# Patient Record
Sex: Female | Born: 1965 | Race: Black or African American | Hispanic: No | Marital: Single | Smoking: Never smoker
Health system: Southern US, Community
[De-identification: ages and names within clinical notes are randomized; demographics above are authoritative.]

## PROBLEM LIST (undated history)

## (undated) DIAGNOSIS — R42 Dizziness and giddiness: Secondary | ICD-10-CM

## (undated) DIAGNOSIS — M1990 Unspecified osteoarthritis, unspecified site: Secondary | ICD-10-CM

## (undated) DIAGNOSIS — M797 Fibromyalgia: Secondary | ICD-10-CM

## (undated) HISTORY — PX: DILATION AND CURETTAGE OF UTERUS: SHX78

---

## 1988-12-29 HISTORY — PX: HAMMER TOE SURGERY: SHX385

## 1998-02-17 ENCOUNTER — Other Ambulatory Visit: Admission: RE | Admit: 1998-02-17 | Discharge: 1998-02-17 | Payer: Self-pay | Admitting: *Deleted

## 1998-08-27 ENCOUNTER — Emergency Department (HOSPITAL_COMMUNITY): Admission: EM | Admit: 1998-08-27 | Discharge: 1998-08-27 | Payer: Self-pay | Admitting: Emergency Medicine

## 1999-09-28 ENCOUNTER — Other Ambulatory Visit: Admission: RE | Admit: 1999-09-28 | Discharge: 1999-09-28 | Payer: Self-pay | Admitting: Obstetrics and Gynecology

## 2001-07-01 ENCOUNTER — Other Ambulatory Visit: Admission: RE | Admit: 2001-07-01 | Discharge: 2001-07-01 | Payer: Self-pay | Admitting: Obstetrics and Gynecology

## 2001-07-07 ENCOUNTER — Encounter: Payer: Self-pay | Admitting: Obstetrics and Gynecology

## 2001-07-07 ENCOUNTER — Ambulatory Visit (HOSPITAL_COMMUNITY): Admission: RE | Admit: 2001-07-07 | Discharge: 2001-07-07 | Payer: Self-pay | Admitting: Obstetrics and Gynecology

## 2001-07-15 ENCOUNTER — Ambulatory Visit (HOSPITAL_COMMUNITY): Admission: RE | Admit: 2001-07-15 | Discharge: 2001-07-15 | Payer: Self-pay | Admitting: Obstetrics and Gynecology

## 2001-07-15 ENCOUNTER — Encounter: Payer: Self-pay | Admitting: Obstetrics and Gynecology

## 2002-08-25 ENCOUNTER — Other Ambulatory Visit: Admission: RE | Admit: 2002-08-25 | Discharge: 2002-08-25 | Payer: Self-pay | Admitting: Obstetrics and Gynecology

## 2003-04-16 ENCOUNTER — Other Ambulatory Visit: Admission: RE | Admit: 2003-04-16 | Discharge: 2003-04-16 | Payer: Self-pay | Admitting: Obstetrics and Gynecology

## 2003-04-19 ENCOUNTER — Encounter (INDEPENDENT_AMBULATORY_CARE_PROVIDER_SITE_OTHER): Payer: Self-pay | Admitting: Specialist

## 2003-04-19 ENCOUNTER — Ambulatory Visit (HOSPITAL_COMMUNITY): Admission: RE | Admit: 2003-04-19 | Discharge: 2003-04-19 | Payer: Self-pay | Admitting: Obstetrics and Gynecology

## 2004-03-15 ENCOUNTER — Encounter: Admission: RE | Admit: 2004-03-15 | Discharge: 2004-03-15 | Payer: Self-pay | Admitting: Neurology

## 2004-04-24 ENCOUNTER — Emergency Department (HOSPITAL_COMMUNITY): Admission: EM | Admit: 2004-04-24 | Discharge: 2004-04-24 | Payer: Self-pay | Admitting: *Deleted

## 2004-05-03 ENCOUNTER — Encounter: Admission: RE | Admit: 2004-05-03 | Discharge: 2004-05-03 | Payer: Self-pay | Admitting: Obstetrics and Gynecology

## 2004-05-10 ENCOUNTER — Encounter: Admission: RE | Admit: 2004-05-10 | Discharge: 2004-05-10 | Payer: Self-pay | Admitting: Obstetrics and Gynecology

## 2004-08-15 ENCOUNTER — Ambulatory Visit: Payer: Self-pay | Admitting: Internal Medicine

## 2005-01-31 ENCOUNTER — Ambulatory Visit: Payer: Self-pay | Admitting: Family Medicine

## 2005-03-14 ENCOUNTER — Encounter: Admission: RE | Admit: 2005-03-14 | Discharge: 2005-03-14 | Payer: Self-pay | Admitting: Family Medicine

## 2005-05-02 ENCOUNTER — Ambulatory Visit: Payer: Self-pay | Admitting: *Deleted

## 2005-05-09 ENCOUNTER — Ambulatory Visit: Payer: Self-pay | Admitting: Family Medicine

## 2005-07-05 ENCOUNTER — Ambulatory Visit (HOSPITAL_COMMUNITY): Admission: RE | Admit: 2005-07-05 | Discharge: 2005-07-05 | Payer: Self-pay | Admitting: Family Medicine

## 2005-07-05 ENCOUNTER — Ambulatory Visit: Payer: Self-pay | Admitting: Family Medicine

## 2005-08-08 ENCOUNTER — Ambulatory Visit: Payer: Self-pay | Admitting: Family Medicine

## 2007-05-07 ENCOUNTER — Encounter (INDEPENDENT_AMBULATORY_CARE_PROVIDER_SITE_OTHER): Payer: Self-pay | Admitting: Family Medicine

## 2007-05-08 ENCOUNTER — Encounter (INDEPENDENT_AMBULATORY_CARE_PROVIDER_SITE_OTHER): Payer: Self-pay | Admitting: Family Medicine

## 2007-05-08 ENCOUNTER — Ambulatory Visit: Payer: Self-pay | Admitting: Nurse Practitioner

## 2007-08-25 ENCOUNTER — Telehealth (INDEPENDENT_AMBULATORY_CARE_PROVIDER_SITE_OTHER): Payer: Self-pay | Admitting: Family Medicine

## 2007-09-02 ENCOUNTER — Ambulatory Visit (HOSPITAL_COMMUNITY): Admission: RE | Admit: 2007-09-02 | Discharge: 2007-09-02 | Payer: Self-pay | Admitting: Family Medicine

## 2007-10-07 ENCOUNTER — Encounter (INDEPENDENT_AMBULATORY_CARE_PROVIDER_SITE_OTHER): Payer: Self-pay | Admitting: Family Medicine

## 2007-10-07 ENCOUNTER — Ambulatory Visit: Payer: Self-pay | Admitting: Family Medicine

## 2007-10-07 LAB — CONVERTED CEMR LAB
ALT: 11 units/L (ref 0–35)
AST: 17 units/L (ref 0–37)
Albumin: 4.8 g/dL (ref 3.5–5.2)
Alkaline Phosphatase: 69 units/L (ref 39–117)
BUN: 10 mg/dL (ref 6–23)
Basophils Absolute: 0 10*3/uL (ref 0.0–0.1)
Basophils Relative: 0 % (ref 0–1)
CO2: 25 meq/L (ref 19–32)
Calcium: 9.5 mg/dL (ref 8.4–10.5)
Chlamydia, DNA Probe: NEGATIVE
Chloride: 104 meq/L (ref 96–112)
Cholesterol: 138 mg/dL (ref 0–200)
Creatinine, Ser: 0.71 mg/dL (ref 0.40–1.20)
Eosinophils Absolute: 0.1 10*3/uL (ref 0.0–0.7)
Eosinophils Relative: 1 % (ref 0–5)
GC Probe Amp, Genital: NEGATIVE
Glucose, Bld: 95 mg/dL (ref 70–99)
HCT: 40.3 % (ref 36.0–46.0)
HDL: 66 mg/dL (ref >39)
Hemoglobin: 13.3 g/dL (ref 12.0–15.0)
LDL Cholesterol: 61 mg/dL (ref 0–99)
Lymphocytes Relative: 46 % (ref 12–46)
Lymphs Abs: 2.9 10*3/uL (ref 0.7–4.0)
MCHC: 33 g/dL (ref 30.0–36.0)
MCV: 93.1 fL (ref 78.0–100.0)
Monocytes Absolute: 0.5 10*3/uL (ref 0.1–1.0)
Monocytes Relative: 8 % (ref 3–12)
Neutro Abs: 2.8 10*3/uL (ref 1.7–7.7)
Neutrophils Relative %: 45 % (ref 43–77)
Pap Smear: NORMAL
Platelets: 242 10*3/uL (ref 150–400)
Potassium: 4.1 meq/L (ref 3.5–5.3)
RBC: 4.33 M/uL (ref 3.87–5.11)
RDW: 12.2 % (ref 11.5–15.5)
Sodium: 139 meq/L (ref 135–145)
TSH: 1.753 microintl units/mL (ref 0.350–5.50)
Total Bilirubin: 0.5 mg/dL (ref 0.3–1.2)
Total CHOL/HDL Ratio: 2.1
Total Protein: 8.2 g/dL (ref 6.0–8.3)
Triglycerides: 54 mg/dL (ref <150)
VLDL: 11 mg/dL (ref 0–40)
WBC: 6.2 10*3/uL (ref 4.0–10.5)

## 2007-10-14 ENCOUNTER — Encounter (INDEPENDENT_AMBULATORY_CARE_PROVIDER_SITE_OTHER): Payer: Self-pay | Admitting: Family Medicine

## 2007-10-21 ENCOUNTER — Ambulatory Visit (HOSPITAL_COMMUNITY): Admission: RE | Admit: 2007-10-21 | Discharge: 2007-10-21 | Payer: Self-pay | Admitting: Family Medicine

## 2007-12-30 ENCOUNTER — Telehealth (INDEPENDENT_AMBULATORY_CARE_PROVIDER_SITE_OTHER): Payer: Self-pay | Admitting: Family Medicine

## 2008-01-08 ENCOUNTER — Ambulatory Visit: Payer: Self-pay | Admitting: Nurse Practitioner

## 2008-01-08 DIAGNOSIS — N6019 Diffuse cystic mastopathy of unspecified breast: Secondary | ICD-10-CM

## 2008-01-09 ENCOUNTER — Encounter (INDEPENDENT_AMBULATORY_CARE_PROVIDER_SITE_OTHER): Payer: Self-pay | Admitting: Family Medicine

## 2008-01-12 ENCOUNTER — Ambulatory Visit: Payer: Self-pay | Admitting: Family Medicine

## 2008-01-15 ENCOUNTER — Ambulatory Visit: Payer: Self-pay | Admitting: Nurse Practitioner

## 2008-03-04 ENCOUNTER — Ambulatory Visit: Payer: Self-pay | Admitting: Obstetrics and Gynecology

## 2008-03-11 ENCOUNTER — Telehealth (INDEPENDENT_AMBULATORY_CARE_PROVIDER_SITE_OTHER): Payer: Self-pay | Admitting: Family Medicine

## 2008-03-16 ENCOUNTER — Encounter (INDEPENDENT_AMBULATORY_CARE_PROVIDER_SITE_OTHER): Payer: Self-pay | Admitting: Family Medicine

## 2008-04-05 ENCOUNTER — Ambulatory Visit (HOSPITAL_COMMUNITY): Admission: RE | Admit: 2008-04-05 | Discharge: 2008-04-05 | Payer: Self-pay | Admitting: Obstetrics & Gynecology

## 2008-04-05 ENCOUNTER — Ambulatory Visit: Payer: Self-pay | Admitting: Obstetrics & Gynecology

## 2008-06-03 ENCOUNTER — Ambulatory Visit: Payer: Self-pay | Admitting: Obstetrics and Gynecology

## 2009-01-24 ENCOUNTER — Ambulatory Visit: Payer: Self-pay | Admitting: Nurse Practitioner

## 2009-01-24 ENCOUNTER — Ambulatory Visit (HOSPITAL_COMMUNITY): Admission: RE | Admit: 2009-01-24 | Discharge: 2009-01-24 | Payer: Self-pay | Admitting: Nurse Practitioner

## 2009-01-24 ENCOUNTER — Encounter (INDEPENDENT_AMBULATORY_CARE_PROVIDER_SITE_OTHER): Payer: Self-pay | Admitting: Nurse Practitioner

## 2009-01-24 DIAGNOSIS — M542 Cervicalgia: Secondary | ICD-10-CM

## 2009-01-24 DIAGNOSIS — K219 Gastro-esophageal reflux disease without esophagitis: Secondary | ICD-10-CM

## 2009-01-24 DIAGNOSIS — M2550 Pain in unspecified joint: Secondary | ICD-10-CM

## 2009-01-24 DIAGNOSIS — R3129 Other microscopic hematuria: Secondary | ICD-10-CM | POA: Insufficient documentation

## 2009-01-24 LAB — CONVERTED CEMR LAB
ALT: 12 units/L (ref 0–35)
AST: 18 units/L (ref 0–37)
Albumin: 4.5 g/dL (ref 3.5–5.2)
Alkaline Phosphatase: 62 units/L (ref 39–117)
BUN: 9 mg/dL (ref 6–23)
Basophils Absolute: 0 10*3/uL (ref 0.0–0.1)
Basophils Relative: 0 % (ref 0–1)
CO2: 26 meq/L (ref 19–32)
CRP: 0 mg/dL (ref <0.6)
Calcium: 9.8 mg/dL (ref 8.4–10.5)
Chlamydia, DNA Probe: NEGATIVE
Chloride: 104 meq/L (ref 96–112)
Creatinine, Ser: 0.82 mg/dL (ref 0.40–1.20)
Eosinophils Absolute: 0 10*3/uL (ref 0.0–0.7)
Eosinophils Relative: 1 % (ref 0–5)
GC Probe Amp, Genital: NEGATIVE
Glucose, Bld: 75 mg/dL (ref 70–99)
Glucose, Urine, Semiquant: NEGATIVE
HCT: 38.8 % (ref 36.0–46.0)
Helicobacter Pylori Antibody-IgG: 7.6 — ABNORMAL HIGH
Hemoglobin: 12.7 g/dL (ref 12.0–15.0)
Lymphocytes Relative: 37 % (ref 12–46)
Lymphs Abs: 2 10*3/uL (ref 0.7–4.0)
MCHC: 32.7 g/dL (ref 30.0–36.0)
MCV: 93.9 fL (ref 78.0–100.0)
Microalb, Ur: 1.63 mg/dL (ref 0.00–1.89)
Monocytes Absolute: 0.4 10*3/uL (ref 0.1–1.0)
Monocytes Relative: 7 % (ref 3–12)
Neutro Abs: 3 10*3/uL (ref 1.7–7.7)
Neutrophils Relative %: 55 % (ref 43–77)
Nitrite: NEGATIVE
Platelets: 219 10*3/uL (ref 150–400)
Potassium: 3.7 meq/L (ref 3.5–5.3)
Protein, U semiquant: 30
RBC: 4.13 M/uL (ref 3.87–5.11)
RDW: 12.5 % (ref 11.5–15.5)
Rapid Strep: NEGATIVE
Rheumatoid fact SerPl-aCnc: <20 intl units/mL (ref 0–20)
Sed Rate: 2 mm/hr (ref 0–22)
Sodium: 141 meq/L (ref 135–145)
Specific Gravity, Urine: >=1.03
TSH: 0.99 microintl units/mL (ref 0.350–4.500)
Total Bilirubin: 0.5 mg/dL (ref 0.3–1.2)
Total Protein: 8 g/dL (ref 6.0–8.3)
Urobilinogen, UA: 1
WBC Urine, dipstick: NEGATIVE
WBC: 5.4 10*3/uL (ref 4.0–10.5)
pH: 5

## 2009-01-25 ENCOUNTER — Encounter (INDEPENDENT_AMBULATORY_CARE_PROVIDER_SITE_OTHER): Payer: Self-pay | Admitting: Nurse Practitioner

## 2009-01-26 ENCOUNTER — Encounter (INDEPENDENT_AMBULATORY_CARE_PROVIDER_SITE_OTHER): Payer: Self-pay | Admitting: Nurse Practitioner

## 2009-01-31 ENCOUNTER — Ambulatory Visit (HOSPITAL_COMMUNITY): Admission: RE | Admit: 2009-01-31 | Discharge: 2009-01-31 | Payer: Self-pay | Admitting: Internal Medicine

## 2009-01-31 LAB — CONVERTED CEMR LAB: Helicobacter pylori, IgM: 1.45 — ABNORMAL HIGH (ref <0.89)

## 2009-02-03 ENCOUNTER — Encounter (INDEPENDENT_AMBULATORY_CARE_PROVIDER_SITE_OTHER): Payer: Self-pay | Admitting: Nurse Practitioner

## 2009-08-26 ENCOUNTER — Encounter: Admission: RE | Admit: 2009-08-26 | Discharge: 2009-08-26 | Payer: Self-pay | Admitting: Internal Medicine

## 2009-12-10 IMAGING — CR DG CERVICAL SPINE COMPLETE 4+V
6 series · 6 of 6 positions shown · non-contrast
Comparison: Cervical spine radiographs [REDACTED]

CLINICAL DATA: Neck pain.

CERVICAL SPINE - COMPLETE 4+ VIEW

[w c-spine lat]
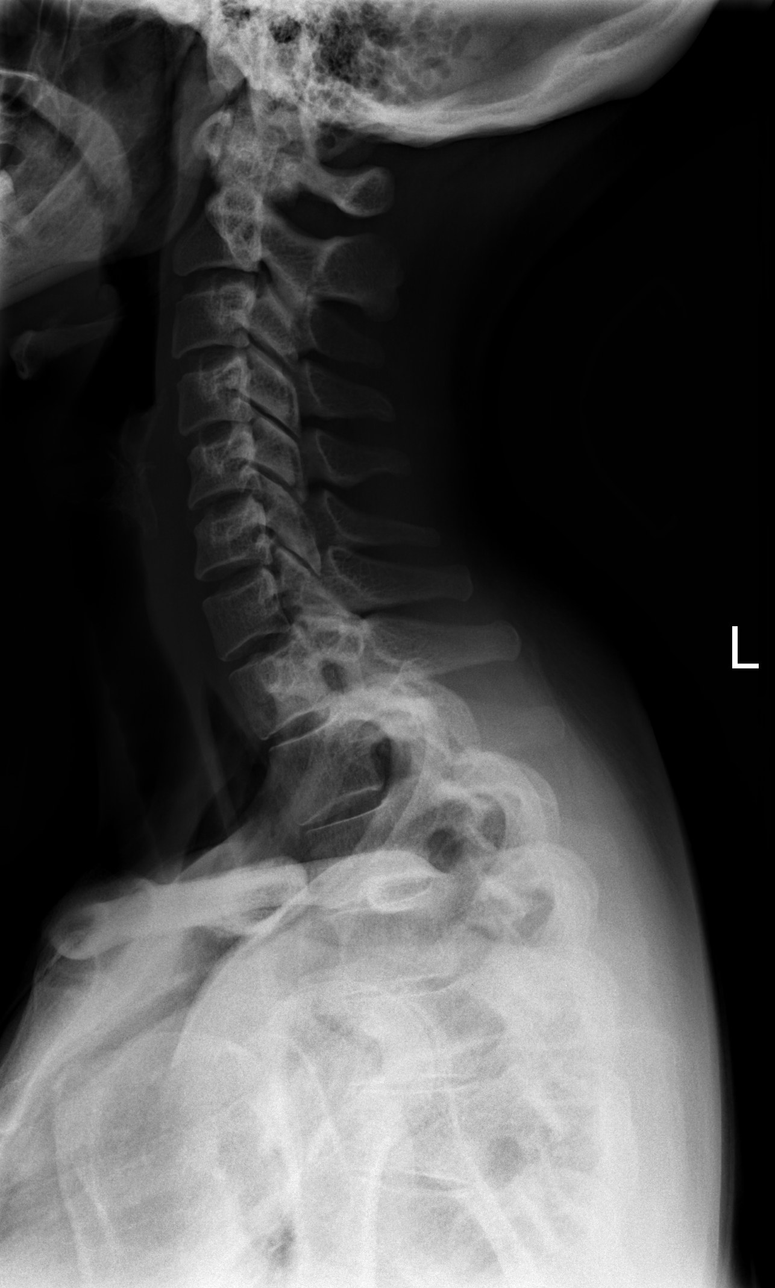

[w c-spine oblique]
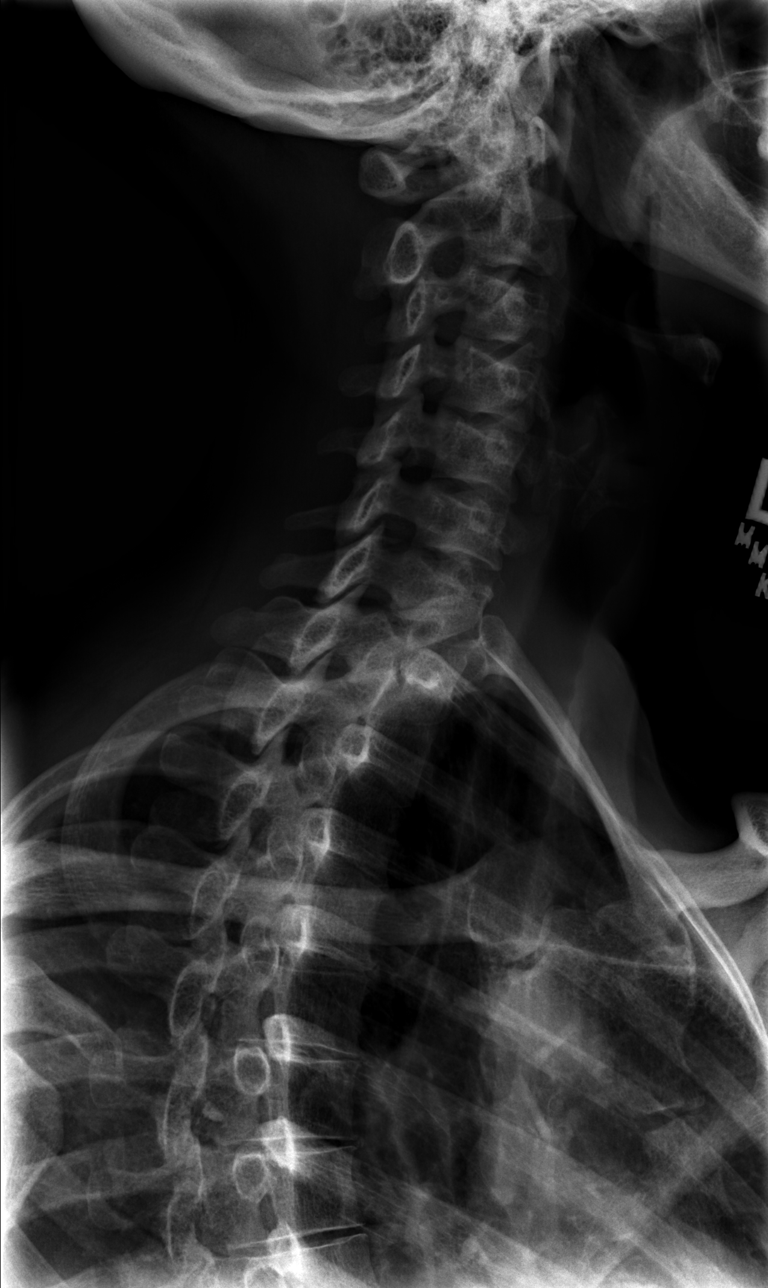

[w c-spine oblique *]
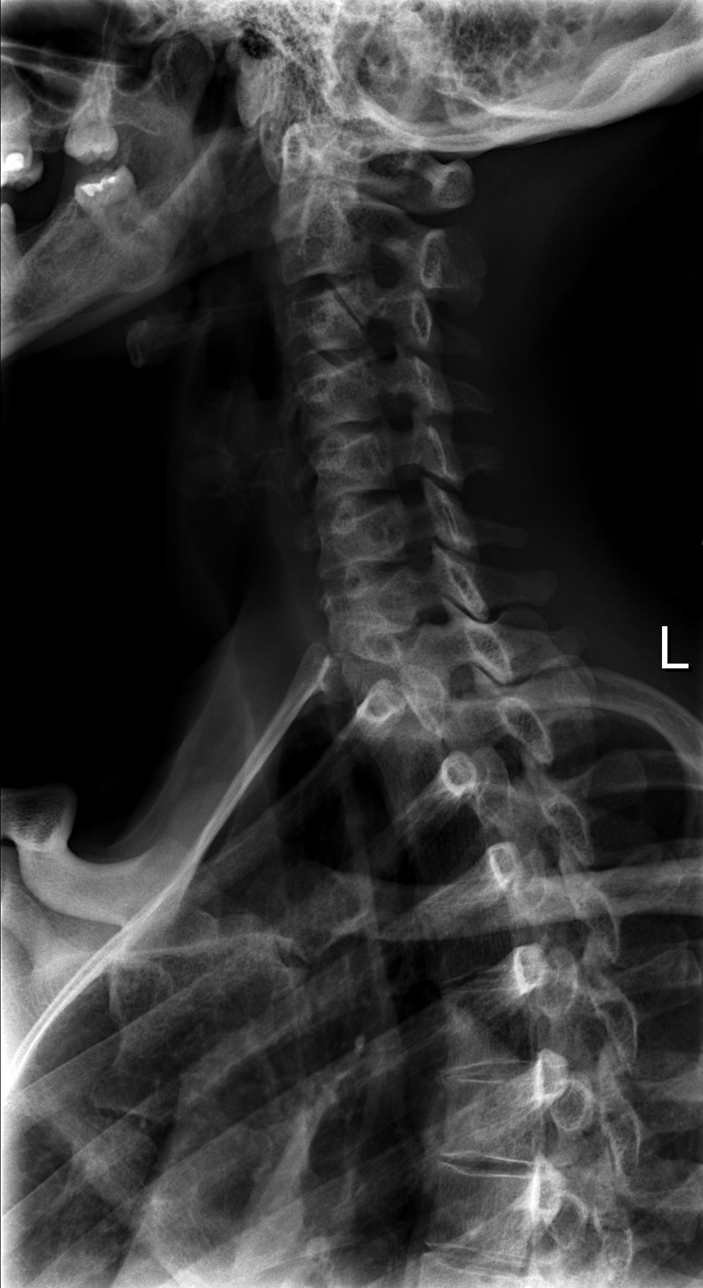

[w c-spine a.p.]
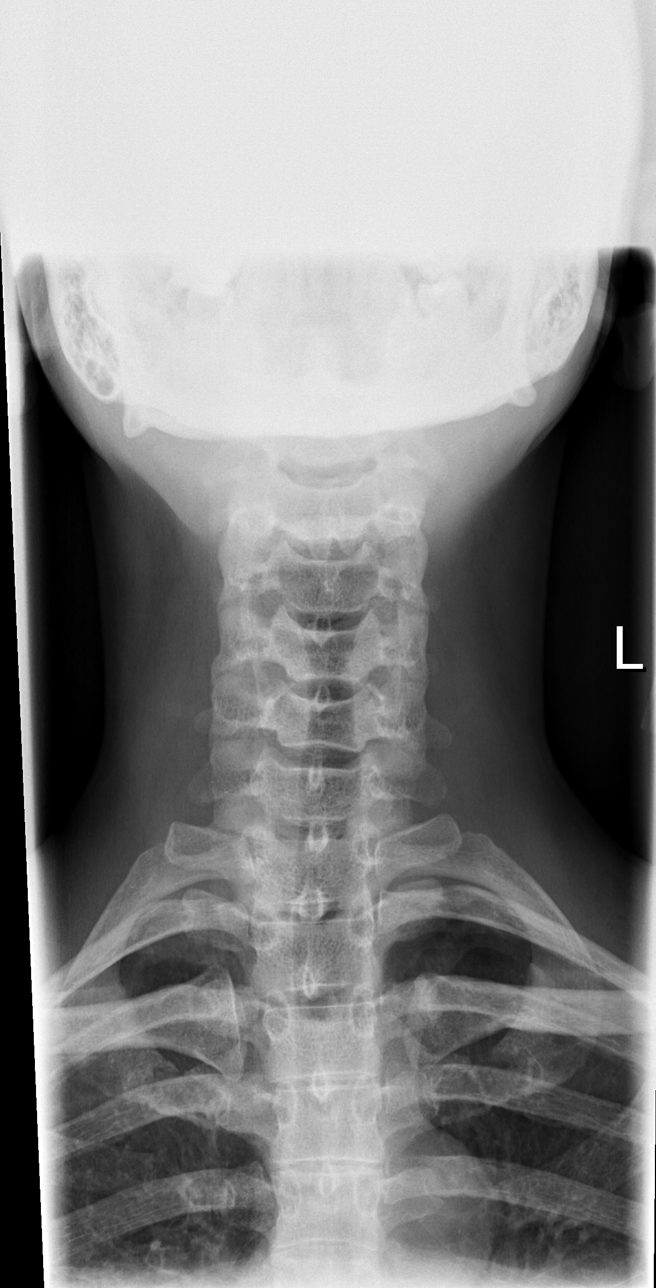

[w c-spine odontoid (1 of 2)]
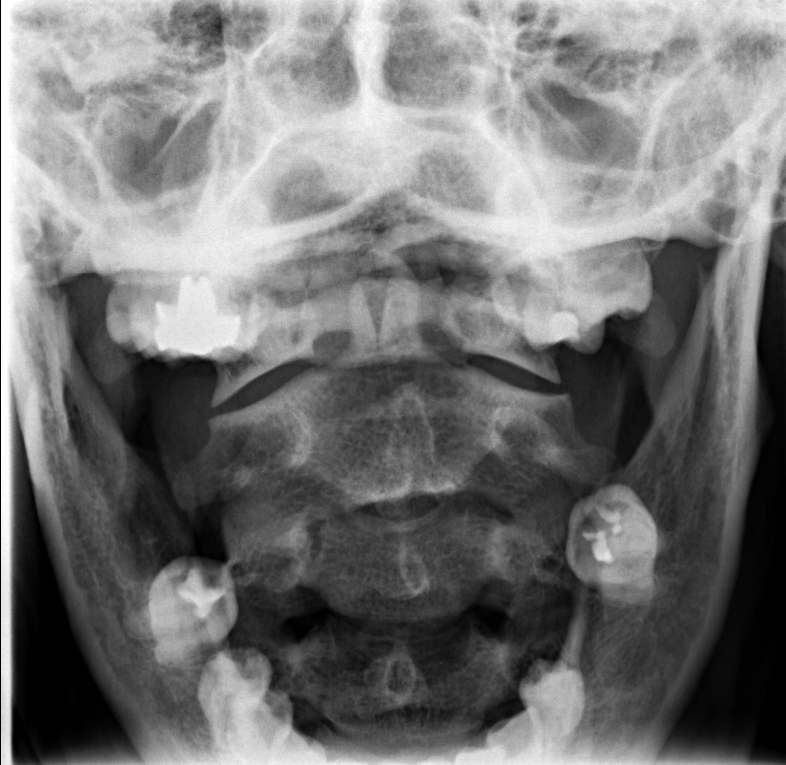

[w c-spine odontoid (2 of 2)]
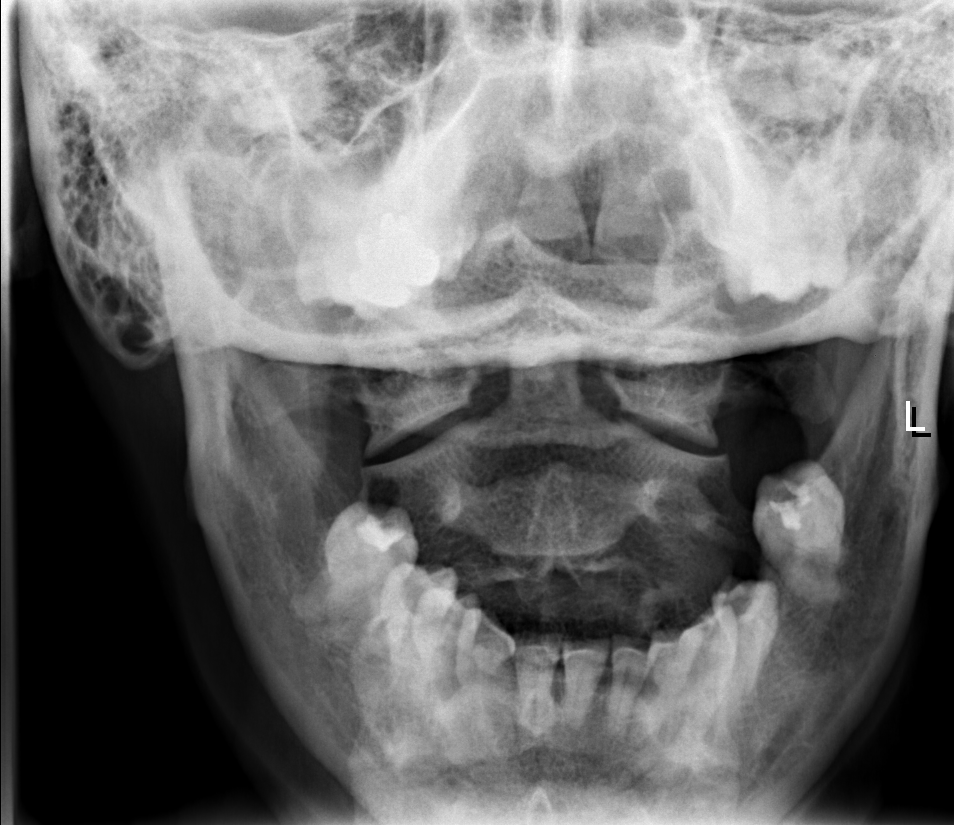

[6 of 6 positions shown; findings below may reference images not displayed]

FINDINGS: The lateral film demonstrates normal alignment of the
cervical vertebral bodies.  Disc spaces and vertebral bodies are
maintained.  No acute bony findings or abnormal prevertebral soft
tissue swelling.

The oblique films demonstrate normally aligned articular facets and
patent neural foramen.  The C1-C2 articulations are maintained.
The lung apices are clear.
IMPRESSION: Normal alignment, no acute bony findings or significant
degenerative changes.

## 2010-04-05 ENCOUNTER — Ambulatory Visit: Payer: Self-pay | Admitting: Nurse Practitioner

## 2010-04-05 DIAGNOSIS — M25519 Pain in unspecified shoulder: Secondary | ICD-10-CM

## 2010-04-14 ENCOUNTER — Encounter
Admission: RE | Admit: 2010-04-14 | Discharge: 2010-04-14 | Payer: Self-pay | Source: Home / Self Care | Attending: Internal Medicine | Admitting: Internal Medicine

## 2010-04-18 ENCOUNTER — Telehealth (INDEPENDENT_AMBULATORY_CARE_PROVIDER_SITE_OTHER): Payer: Self-pay | Admitting: Nurse Practitioner

## 2010-06-01 NOTE — Letter (Signed)
Summary: Handout Printed  Printed Handout:  - Rotator Cuff Tendinitis (Tendonitis, Tenosynovitis)

## 2010-06-01 NOTE — Letter (Signed)
Summary: TEST ORDER FORM//MAMMOGRAM//APPT DATE & TIME  TEST ORDER FORM//MAMMOGRAM//APPT DATE & TIME   Imported By: Arta Bruce 04/10/2010 16:55:24  _____________________________________________________________________  External Attachment:    Type:   Image     Comment:   External Document

## 2010-06-01 NOTE — Assessment & Plan Note (Signed)
Summary: Left Chest/Shoulder pain   Vital Signs:  Patient profile:   45 year old female Menstrual status:  regular Weight:      143.3 pounds BMI:     23.21 Temp:     97.4 degrees F oral Pulse rate:   76 / minute Pulse rhythm:   regular Resp:     20 per minute BP sitting:   130 / 80  (left arm) Cuff size:   regular  Vitals Entered By: Levon Hedger (April 05, 2010 4:17 PM) CC: left side  neck, shoulder,  chest and breast discomfort with tenderness interfering with day to day activities Is Patient Diabetic? No Pain Assessment Patient in pain? yes     Location: neck, chest, breast Intensity: 7  Does patient need assistance? Functional Status Self care Ambulation Normal   CC:  left side  neck, shoulder, and chest and breast discomfort with tenderness interfering with day to day activities.  History of Present Illness:  Pt into the office with c/o left upper body pain and discomfort.  Left Breast - Pt has been having ongoing problems with left breast History of fibrocystic breast Left upper inner breast tenderness with palpation  also feels like breast is "heavy" at times  Left shoulder - Ongoing problem for the past several years. She does take advil otc which does suppress the pain She also switched to a natural anti-inflammatory (Tummeric) Pt is able to lift the arm and go through ROM but it is very uncomfortable. Pt would like to be referred to Orthopedic -   Social - Pt is employed at [employer]    Habits & Providers  Alcohol-Tobacco-Diet     Alcohol drinks/day: 0     Tobacco Status: never  Exercise-Depression-Behavior     Have you felt down or hopeless? no     Have you felt little pleasure in things? no     Drug Use: no     Seat Belt Use: 100     Sun Exposure: occasionally  Current Medications (verified): 1)  None  Allergies (verified): No Known Drug Allergies  Review of Systems General:  Denies fever. CV:  Denies chest pain or  discomfort. Resp:  Denies cough. GI:  Denies abdominal pain, nausea, and vomiting. MS:  Complains of joint pain; left shoulder.  Physical Exam  General:  alert.   Head:  normocephalic.   Breasts:  left breast - tenderness with palpation of left upper inner quad  Lungs:  normal breath sounds.   Heart:  normal rate and regular rhythm.   Neurologic:  alert & oriented X3.     Shoulder/Elbow Exam  Shoulder Exam:    Left:    Inspection:  Normal    Palpation:  Normal    Stability:  stable    Tenderness:  left infrascapular    Swelling:  no    Erythema:  no   Impression & Recommendations:  Problem # 1:  SHOULDER PAIN, LEFT (ICD-719.41) Will refer pt ortho ? tendinitis The following medications were removed from the medication list:    Naprosyn 500 Mg Tabs (Naproxen) .Marland Kitchen... 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 If you have not heard from this office about your referral in the next 2 weeks  then give [place] a courtesy call.   Orders Added: 1)  Est. Patient Level III [65784] 2)  Mammogram (Diagnostic) [Mammo]    Prevention & Chronic Care Immunizations   Influenza vaccine: Not documented   Influenza vaccine deferral: Refused  (04/05/2010)    Tetanus booster: 01/26/2009: Tdap    Pneumococcal vaccine: Not documented  Other Screening   Pap smear: NEGATIVE FOR INTRAEPITHELIAL LESIONS OR MALIGNANCY.  (01/24/2009)    Mammogram: ASSESSMENT: Negative - BI-RADS 1^MM DIGITAL SCREENING  (01/31/2009)   Smoking status: never  (04/05/2010)  Lipids   Total Cholesterol: 138  (10/07/2007)   LDL: 61  (10/07/2007)   LDL Direct: Not documented   HDL: 66  (10/07/2007)   Triglycerides: 54  (10/07/2007)

## 2010-06-01 NOTE — Progress Notes (Signed)
Summary: Ortho referral  Phone Note Outgoing Call   Summary of Call: Pt would like referral to [facility] for left shoulder pain Pt has blue cross and blue shield so she has chosen to be seen in the insured clinic at [facility] referral has been ordered Initial call taken by: Lehman Prom FNP,  April 18, 2010 8:17 AM  Follow-up for Phone Call        I SEND THE REFERRAL TO [facility]  WAITING FOR AN APPT  Follow-up by: Cheryll Dessert,  April 18, 2010 10:31 AM

## 2010-09-12 NOTE — Op Note (Signed)
NAMEKIRAT, MEZQUITA               ACCOUNT NO.:  [id number]   MEDICAL RECORD NO.:  [id number]          PATIENT TYPE:  AMB   LOCATION:  SDC                           [facility]ILITY[facility]  [facility]   PHYSICIAN:  Allie Bossier, MD        DATE OF BIRTH:  02-Mar-1966   DATE OF PROCEDURE:  04/05/2008  DATE OF DISCHARGE:  04/05/2008                               OPERATIVE REPORT   PREOPERATIVE DIAGNOSIS:  Retained intrauterine device.   POSTOPERATIVE DIAGNOSIS:  Retained intrauterine device.   PROCEDURE:  IUD retrieval.   SURGEON:  Allie Bossier, MD   ANESTHESIA:  MAC, Oddono, MD   FINDINGS:  Retrieved IUD.   SPECIMENS:  None.   ESTIMATED BLOOD LOSS:  Minimal.   COMPLICATIONS:  None.   DETAILS OF PROCEDURE AND FINDINGS:  Ms. Drzewiecki was taken to the  operating room, placed in the dorsal lithotomy position.  Anesthesia was  applied without complication.  Her vagina was prepped and draped in  usual sterile fashion.  Speculum was placed.  Using a polyp forceps, I  was able to retrieve the IUD immediately.  She was taken to recovery  room in stable condition.  Instrument, sponge, and needle counts were  correct.      Allie Bossier, MD  Electronically Signed     MCD/MEDQ  D:  04/15/2008  T:  04/15/2008  Job:  [id number]

## 2010-09-12 NOTE — Group Therapy Note (Signed)
NAME:  Jacqueline, Bray NO.:  [id number]   MEDICAL RECORD NO.:  [id number]          PATIENT TYPE:  WOC   LOCATION:  [facility]                   FACILITY:  [facility]   PHYSICIAN:  Argentina Donovan, MD        DATE OF BIRTH:  06-30-1965   DATE OF SERVICE:  03/04/2008                                  CLINIC NOTE   The patient is a 45 year old African-American female, gravida 5, para 1-  0-4-1 with absolutely good health with no medical problems with the  exception of a sore right hip, for which she takes Flexeril.  She has  been having some discomfort with breast fibrocystic disease recently and  wanted the IUD out, as her primary care told her maybe that is what is  causing it.  She has a [employer] fast streamer for 4 years.  They got an ultrasound which  showed the Mirena in place.  Unfortunately, we tried to fish it out,  could not see the string or feel the string, and I feel that without  giving the patient more discomfort, it would be easier to do under  sedation in the OR, so I am going to schedule her for a D&C and removal  of IUD.   DIAGNOSIS:  Trapped IUD.           ______________________________  Argentina Donovan, MD     PR/MEDQ  D:  03/04/2008  T:  03/04/2008  Job:  [id number]

## 2010-09-15 NOTE — Op Note (Signed)
NAME:  Jacqueline Bray, Jacqueline Bray                         ACCOUNT NO.:  [id number]   MEDICAL RECORD NO.:  [id number]                   PATIENT TYPE:  AMB   LOCATION:  SDC                                  [facility]  [facility]   PHYSICIAN:  Michelle L. Vincente Poli, M.D.            DATE OF BIRTH:  Oct 10, 1965   DATE OF PROCEDURE:  04/19/2003  DATE OF DISCHARGE:                                 OPERATIVE REPORT   PREOPERATIVE DIAGNOSIS:  Missed abortion.   POSTOPERATIVE DIAGNOSIS:  Missed abortion.   PROCEDURE:  Dilatation and evacuation.   SURGEON:  Michelle L. Vincente Poli, M.D.   ANESTHESIA:  MAC and paracervical block.   PATHOLOGY:  Products of conception.   DESCRIPTION OF PROCEDURE:  The patient was taken to the operating room,  where she was given sedation and placed in the dorsal lithotomy position.  The vagina and vulva were prepped and draped in the usual sterile fashion.  An in-and-out catheter was used to empty the bladder.  A sterile drape was  applied.  A speculum was inserted into the vagina, the cervix was grasped  with a tenaculum, and a paracervical block was performed in the standard  fashion.  The internal os was gently dilated using Pratt dilators.  A #7  suction curette was inserted and a suction curettage was performed of the  entire uterus and retrieval of contents consistent with products of  conception.  We then did send a portion of this for chromosome analysis per  patient request.  After the suction curettage was performed, a sharp curette  was inserted and the uterus was thoroughly curetted of all tissue.  A final  suction curettage was performed.  At the end of the procedure all  instruments were removed from the vagina.  There was no vaginal bleeding  noted.  All sponge, lap, and instrument counts were correct x2.  The patient  tolerated the procedure well and went to the recovery room in stable  condition.                                               Michelle L. Vincente Poli,  M.D.    Florestine Avers  D:  04/19/2003  T:  04/20/2003  Job:  [id number]

## 2011-02-02 LAB — CBC
HCT: 36.1 % (ref 36.0–46.0)
Hemoglobin: 12.4 g/dL (ref 12.0–15.0)
MCHC: 34.2 g/dL (ref 30.0–36.0)
MCV: 94.8 fL (ref 78.0–100.0)
Platelets: 195 10*3/uL (ref 150–400)
RBC: 3.81 MIL/uL — ABNORMAL LOW (ref 3.87–5.11)
RDW: 12.5 % (ref 11.5–15.5)
WBC: 6.1 10*3/uL (ref 4.0–10.5)

## 2012-03-30 ENCOUNTER — Emergency Department (HOSPITAL_BASED_OUTPATIENT_CLINIC_OR_DEPARTMENT_OTHER): Payer: Managed Care, Other (non HMO)

## 2012-03-30 ENCOUNTER — Emergency Department (HOSPITAL_BASED_OUTPATIENT_CLINIC_OR_DEPARTMENT_OTHER)
Admission: EM | Admit: 2012-03-30 | Discharge: 2012-03-30 | Disposition: A | Discharge status: Home / Self Care | Payer: Managed Care, Other (non HMO) | Attending: Emergency Medicine | Admitting: Emergency Medicine

## 2012-03-30 ENCOUNTER — Encounter (HOSPITAL_BASED_OUTPATIENT_CLINIC_OR_DEPARTMENT_OTHER): Payer: Self-pay | Admitting: *Deleted

## 2012-03-30 DIAGNOSIS — R110 Nausea: Secondary | ICD-10-CM | POA: Insufficient documentation

## 2012-03-30 DIAGNOSIS — R0789 Other chest pain: Secondary | ICD-10-CM | POA: Insufficient documentation

## 2012-03-30 DIAGNOSIS — R42 Dizziness and giddiness: Secondary | ICD-10-CM | POA: Insufficient documentation

## 2012-03-30 DIAGNOSIS — Z79899 Other long term (current) drug therapy: Secondary | ICD-10-CM | POA: Insufficient documentation

## 2012-03-30 DIAGNOSIS — R0602 Shortness of breath: Secondary | ICD-10-CM | POA: Insufficient documentation

## 2012-03-30 LAB — COMPREHENSIVE METABOLIC PANEL
ALT: 12 U/L (ref 0–35)
AST: 19 U/L (ref 0–37)
Albumin: 4.3 g/dL (ref 3.5–5.2)
BUN: 12 mg/dL (ref 6–23)
CO2: 25 mEq/L (ref 19–32)
Chloride: 104 mEq/L (ref 96–112)
Creatinine, Ser: 0.8 mg/dL (ref 0.50–1.10)
GFR calc non Af Amer: 86 mL/min — ABNORMAL LOW (ref >90)
Glucose, Bld: 91 mg/dL (ref 70–99)
Sodium: 140 mEq/L (ref 135–145)
Total Bilirubin: 0.3 mg/dL (ref 0.3–1.2)

## 2012-03-30 LAB — CBC WITH DIFFERENTIAL/PLATELET
Basophils Absolute: 0 10*3/uL (ref 0.0–0.1)
Basophils Relative: 0 % (ref 0–1)
HCT: 35.6 % — ABNORMAL LOW (ref 36.0–46.0)
Hemoglobin: 12.1 g/dL (ref 12.0–15.0)
Lymphocytes Relative: 50 % — ABNORMAL HIGH (ref 12–46)
Lymphs Abs: 2.7 10*3/uL (ref 0.7–4.0)
MCHC: 34 g/dL (ref 30.0–36.0)
MCV: 91.3 fL (ref 78.0–100.0)
Monocytes Absolute: 0.5 10*3/uL (ref 0.1–1.0)
Monocytes Relative: 9 % (ref 3–12)
Neutro Abs: 2.2 10*3/uL (ref 1.7–7.7)
RBC: 3.9 MIL/uL (ref 3.87–5.11)
RDW: 11.8 % (ref 11.5–15.5)
WBC: 5.5 10*3/uL (ref 4.0–10.5)

## 2012-03-30 LAB — URINALYSIS, ROUTINE W REFLEX MICROSCOPIC
Glucose, UA: NEGATIVE mg/dL
Specific Gravity, Urine: 1.02 (ref 1.005–1.030)

## 2012-03-30 LAB — PREGNANCY, URINE: Preg Test, Ur: NEGATIVE

## 2012-03-30 LAB — TROPONIN I: Troponin I: <0.3 ng/mL (ref <0.30)

## 2012-03-30 LAB — URINE MICROSCOPIC-ADD ON

## 2012-03-30 MED ORDER — SODIUM CHLORIDE 0.9 % IV SOLN
Freq: Once | INTRAVENOUS | Status: AC
  Administered 2012-03-30: 17:00:00 via INTRAVENOUS

## 2012-03-30 MED ORDER — PREDNISONE 10 MG PO TABS: ORAL_TABLET | ORAL | Status: DC

## 2012-03-30 NOTE — ED Provider Notes (Signed)
History  This chart was scribed for Carleene Cooper III, MD by Ardeen Jourdain, ED Scribe. This patient was seen in room MH04/MH04 and the patient's care was started at 1552.  CSN: [id number]  Arrival date & time 03/30/12  1527   First MD Initiated Contact with Patient 03/30/12 1552      Chief Complaint  Patient presents with  . Chest Pain    The history is provided by the patient. No language interpreter was used.    Jacqueline Bray is a 46 y.o. female who presents to the Emergency Department complaining of acutely worsening CP with associated SOB, dizziness, frequency, diaphoresis, light-headedness and nausea. She denies visual disturbances, ear pain, sore throat, emesis, diarrhea, abdominal pain, rash, seizure, LOC, fever and chills as associated symptoms.  She states she has been having the intermittent pain for the past 6 months, but that today it has become constant and the associated symptoms appeared. She described the pain as a heavy, burning sensation located in her chest wall that radiates from under her breast to her shoulder and neck. She reports being tested for lupus because of her chronic symptoms and the test result was negative. She reports taking ibuprofen for the pain with no relief. She does not have a h/o any other pertinent or chronic medical conditions. She denies smoking and alcohol use. She states her LMP was in September, but that she has had irregular menstrual cycle since taking prednisone.   PCP: Dr. Leonette Most   No past medical history on file.  Past Surgical History  Procedure Date  . Foot surgery     No family history on file.  History  Substance Use Topics  . Smoking status: Never Smoker   . Smokeless tobacco: Never Used  . Alcohol Use: No   No OB history available.   Review of Systems  Constitutional: Positive for diaphoresis. Negative for fever and chills.       Night sweats   HENT: Negative for ear pain and sore throat.   Eyes: Negative for  visual disturbance.  Respiratory: Positive for shortness of breath.   Cardiovascular: Positive for chest pain.  Gastrointestinal: Positive for nausea. Negative for vomiting.  Skin: Negative for rash.  Neurological: Positive for dizziness and light-headedness. Negative for seizures.       No fainting   Psychiatric/Behavioral:       Anxious about Cp and memory issues  All other systems reviewed and are negative.    Allergies  Review of patient's allergies indicates no known allergies.  Home Medications   Current Outpatient Rx  Name  Route  Sig  Dispense  Refill  . IBUPROFEN 200 MG PO TABS   Oral   Take 200 mg by mouth every 6 (six) hours as needed.         Marland Kitchen NAPROXEN SODIUM 220 MG PO TABS   Oral   Take 220 mg by mouth 2 (two) times daily with a meal.           Triage Vitals: BP 143/67  Pulse 83  Temp 98.1 F (36.7 C)  Resp 20  SpO2 100%  LMP 12/30/2011  Physical Exam  Nursing note and vitals reviewed. Constitutional: She is oriented to person, place, and time. She appears well-developed and well-nourished. No distress.  HENT:  Head: Normocephalic and atraumatic.  Right Ear: External ear normal.  Left Ear: External ear normal.  Mouth/Throat: Oropharynx is clear and moist. No oropharyngeal exudate.  Eyes: Conjunctivae normal and EOM  are normal. Pupils are equal, round, and reactive to light.  Neck: Normal range of motion. Neck supple. No tracheal deviation present.  Cardiovascular: Normal rate, regular rhythm and normal heart sounds.   Pulmonary/Chest: Effort normal and breath sounds normal. No respiratory distress.  Abdominal: Soft. Bowel sounds are normal. She exhibits no distension. There is no tenderness.  Musculoskeletal: Normal range of motion. She exhibits no edema.  Neurological: She is alert and oriented to person, place, and time.  Skin: Skin is warm and dry.  Psychiatric: She has a normal mood and affect. Her behavior is normal.    ED Course    Procedures (including critical care time)  DIAGNOSTIC STUDIES: Oxygen Saturation is 100% on room air, normal by my interpretation.    COORDINATION OF CARE:  3:57 PM  Date: 03/30/2012  Rate: 70  Rhythm: normal sinus rhythm  QRS Axis: normal  Intervals: normal QRS:  Left ventricular hypertrophy  ST/T Wave abnormalities: normal  Conduction Disutrbances:none  Narrative Interpretation: Abnormal EKG  Old EKG Reviewed: none available  4:07 PM: Discussed treatment plan which includes an EKG, blood work and a CXR with pt at bedside and pt agreed to plan.   7:22 PM: Pt recheck, she seems normal and comfortable. Lab results were discussed   Results for orders placed during the hospital encounter of 03/30/12  CBC WITH DIFFERENTIAL      Component Value Range   WBC 5.5  4.0 - 10.5 K/uL   RBC 3.90  3.87 - 5.11 MIL/uL   Hemoglobin 12.1  12.0 - 15.0 g/dL   HCT 82.9 (*) 56.2 - 13.0 %   MCV 91.3  78.0 - 100.0 fL   MCH 31.0  26.0 - 34.0 pg   MCHC 34.0  30.0 - 36.0 g/dL   RDW 86.5  78.4 - 69.6 %   Platelets 182  150 - 400 K/uL   Neutrophils Relative 40 (*) 43 - 77 %   Neutro Abs 2.2  1.7 - 7.7 K/uL   Lymphocytes Relative 50 (*) 12 - 46 %   Lymphs Abs 2.7  0.7 - 4.0 K/uL   Monocytes Relative 9  3 - 12 %   Monocytes Absolute 0.5  0.1 - 1.0 K/uL   Eosinophils Relative 1  0 - 5 %   Eosinophils Absolute 0.0  0.0 - 0.7 K/uL   Basophils Relative 0  0 - 1 %   Basophils Absolute 0.0  0.0 - 0.1 K/uL  COMPREHENSIVE METABOLIC PANEL      Component Value Range   Sodium 140  135 - 145 mEq/L   Potassium 3.7  3.5 - 5.1 mEq/L   Chloride 104  96 - 112 mEq/L   CO2 25  19 - 32 mEq/L   Glucose, Bld 91  70 - 99 mg/dL   BUN 12  6 - 23 mg/dL   Creatinine, Ser 2.95  0.50 - 1.10 mg/dL   Calcium 9.7  8.4 - 28.4 mg/dL   Total Protein 7.9  6.0 - 8.3 g/dL   Albumin 4.3  3.5 - 5.2 g/dL   AST 19  0 - 37 U/L   ALT 12  0 - 35 U/L   Alkaline Phosphatase 69  39 - 117 U/L   Total Bilirubin 0.3  0.3 - 1.2 mg/dL    GFR calc non Af Amer 86 (*) >90 mL/min   GFR calc Af Amer >90  >90 mL/min  PREGNANCY, URINE      Component Value Range  Preg Test, Ur NEGATIVE  NEGATIVE  URINALYSIS, ROUTINE W REFLEX MICROSCOPIC      Component Value Range   Color, Urine YELLOW  YELLOW   APPearance CLEAR  CLEAR   Specific Gravity, Urine 1.020  1.005 - 1.030   pH 6.0  5.0 - 8.0   Glucose, UA NEGATIVE  NEGATIVE mg/dL   Hgb urine dipstick TRACE (*) NEGATIVE   Bilirubin Urine NEGATIVE  NEGATIVE   Ketones, ur NEGATIVE  NEGATIVE mg/dL   Protein, ur NEGATIVE  NEGATIVE mg/dL   Urobilinogen, UA 1.0  0.0 - 1.0 mg/dL   Nitrite NEGATIVE  NEGATIVE   Leukocytes, UA NEGATIVE  NEGATIVE  TROPONIN I      Component Value Range   Troponin I <0.30  <0.30 ng/mL  URINE MICROSCOPIC-ADD ON      Component Value Range   Squamous Epithelial / LPF RARE  RARE   WBC, UA 0-2  <3 WBC/hpf   RBC / HPF 3-6  <3 RBC/hpf   Bacteria, UA FEW (*) RARE   7:28 PM Lab workup was negative.  I think she may had an autoimmune disease like lupus or MS.  Will prescribe a short tapering dose of prednisone for a week, and refer her back to her PCP for referral to a neurologist or a rheumatologist.  1. Chest pain, muscular     I personally performed the services described in this documentation, which was scribed in my presence. The recorded information has been reviewed and is accurate. Osvaldo Human, MD     Carleene Cooper III, MD 03/30/12 [id number]

## 2012-03-30 NOTE — ED Notes (Signed)
The patient is undressed from the waist up and in a gown. The bed rail are up  the bed is locked and in the lowest position. The call light is within reach.

## 2012-03-30 NOTE — ED Notes (Signed)
Pt reports chest pain x 6 months- intermitant- reports pain is burning and constant- today feel sob, dizzy and nauseated with pain

## 2012-11-14 ENCOUNTER — Other Ambulatory Visit: Payer: Self-pay

## 2014-09-29 ENCOUNTER — Encounter (HOSPITAL_COMMUNITY): Payer: Self-pay | Admitting: Emergency Medicine

## 2014-09-29 ENCOUNTER — Emergency Department (HOSPITAL_COMMUNITY)
Admission: EM | Admit: 2014-09-29 | Discharge: 2014-09-29 | Disposition: A | Discharge status: Home / Self Care | Payer: 59 | Attending: Emergency Medicine | Admitting: Emergency Medicine

## 2014-09-29 DIAGNOSIS — R6883 Chills (without fever): Secondary | ICD-10-CM | POA: Diagnosis not present

## 2014-09-29 DIAGNOSIS — R52 Pain, unspecified: Secondary | ICD-10-CM | POA: Diagnosis present

## 2014-09-29 DIAGNOSIS — R55 Syncope and collapse: Secondary | ICD-10-CM | POA: Insufficient documentation

## 2014-09-29 DIAGNOSIS — Z79899 Other long term (current) drug therapy: Secondary | ICD-10-CM | POA: Diagnosis not present

## 2014-09-29 LAB — CBC WITH DIFFERENTIAL/PLATELET
Basophils Absolute: 0 10*3/uL (ref 0.0–0.1)
Basophils Relative: 0 % (ref 0–1)
Eosinophils Absolute: 0 10*3/uL (ref 0.0–0.7)
Eosinophils Relative: 0 % (ref 0–5)
HCT: 37.1 % (ref 36.0–46.0)
Hemoglobin: 12 g/dL (ref 12.0–15.0)
Lymphocytes Relative: 12 % (ref 12–46)
Lymphs Abs: 0.8 10*3/uL (ref 0.7–4.0)
MCH: 29.9 pg (ref 26.0–34.0)
MCHC: 32.3 g/dL (ref 30.0–36.0)
MCV: 92.5 fL (ref 78.0–100.0)
Monocytes Absolute: 1 10*3/uL (ref 0.1–1.0)
Monocytes Relative: 14 % — ABNORMAL HIGH (ref 3–12)
Neutro Abs: 4.9 10*3/uL (ref 1.7–7.7)
Neutrophils Relative %: 74 % (ref 43–77)
Platelets: 161 10*3/uL (ref 150–400)
RBC: 4.01 MIL/uL (ref 3.87–5.11)
RDW: 13.1 % (ref 11.5–15.5)
WBC: 6.7 10*3/uL (ref 4.0–10.5)

## 2014-09-29 LAB — BASIC METABOLIC PANEL
Anion gap: 8 (ref 5–15)
BUN: 11 mg/dL (ref 6–20)
CO2: 27 mmol/L (ref 22–32)
Calcium: 9.4 mg/dL (ref 8.9–10.3)
Chloride: 104 mmol/L (ref 101–111)
Creatinine, Ser: 0.91 mg/dL (ref 0.44–1.00)
GFR calc Af Amer: >60 mL/min (ref >60)
GFR calc non Af Amer: >60 mL/min (ref >60)
Glucose, Bld: 93 mg/dL (ref 65–99)
Potassium: 3.8 mmol/L (ref 3.5–5.1)
Sodium: 139 mmol/L (ref 135–145)

## 2014-09-29 LAB — TROPONIN I: Troponin I: <0.03 ng/mL (ref <0.031)

## 2014-09-29 MED ORDER — SODIUM CHLORIDE 0.9 % IV BOLUS (SEPSIS)
1000.0000 mL | Freq: Once | INTRAVENOUS | Status: AC
  Administered 2014-09-29: 1000 mL via INTRAVENOUS

## 2014-09-29 NOTE — ED Notes (Signed)
Pt reports she has chills, cough, and body aches since yesterday, at work she felt sweaty and ill and her coworkers attached an AED to her and began attempting shallow chest compressions. Pt was conscious and alert during this process and EMS reported that she was A&O with normal vitals and CBG on their arrival. They stopped the efforts at CPR and patient reported cold like symptoms.

## 2014-09-29 NOTE — Discharge Instructions (Signed)
Near-Syncope Near-syncope (commonly known as near fainting) is sudden weakness, dizziness, or feeling like you might pass out. During an episode of near-syncope, you may also develop pale skin, have tunnel vision, or feel sick to your stomach (nauseous). Near-syncope may occur when getting up after sitting or while standing for a long time. It is caused by a sudden decrease in blood flow to the brain. This decrease can result from various causes or triggers, most of which are not serious. However, because near-syncope can sometimes be a sign of something serious, a medical evaluation is required. The specific cause is often not determined. HOME CARE INSTRUCTIONS  Monitor your condition for any changes. The following actions may help to alleviate any discomfort you are experiencing:  Have someone stay with you until you feel stable.  Lie down right away and prop your feet up if you start feeling like you might faint. Breathe deeply and steadily. Wait until all the symptoms have passed. Most of these episodes last only a few minutes. You may feel tired for several hours.   Drink enough fluids to keep your urine clear or pale yellow.   If you are taking blood pressure or heart medicine, get up slowly when seated or lying down. Take several minutes to sit and then stand. This can reduce dizziness.  Follow up with your health care provider as directed. SEEK IMMEDIATE MEDICAL CARE IF:   You have a severe headache.   You have unusual pain in the chest, abdomen, or back.   You are bleeding from the mouth or rectum, or you have black or tarry stool.   You have an irregular or very fast heartbeat.   You have repeated fainting or have seizure-like jerking during an episode.   You faint when sitting or lying down.   You have confusion.   You have difficulty walking.   You have severe weakness.   You have vision problems.  MAKE SURE YOU:   Understand these instructions.  Will  watch your condition.  Will get help right away if you are not doing well or get worse. Document Released: 04/16/2005 Document Revised: 04/21/2013 Document Reviewed: 09/19/2012 Exit[employer] Patient Information 2015 [employer]. This information is not intended to replace advice given to you by your health care provider. Make sure you discuss any questions you have with your health care provider.  

## 2014-09-29 NOTE — ED Notes (Signed)
Bed: WA23 Expected date:  Expected time:  Means of arrival:  Comments: EMS/flu like symptoms 

## 2014-10-08 NOTE — ED Provider Notes (Signed)
CSN: [id number]     Arrival date & time 09/29/14  0855 History   First MD Initiated Contact with Patient 09/29/14 [phone]     Chief Complaint  Patient presents with  . Chills  . Generalized Body Aches     (Consider location/radiation/quality/duration/timing/severity/associated sxs/prior Treatment) HPI   89y F with near syncope. Onset shortly before arrival. Was at work when began to feel sweaty and as if she may pass out. Lowered herself to ground. Lower herself to the ground. Did not lose consciousness. Apparently there was some bystander CPR to "massage her heart." The ED was also attached. Again, there is no reported loss of consciousness or pulses. Since yesterday she's been having chills, occasional cough and some body aches. Currently feels better. Mild generalized weakness, otherwise no acute complaints right now. No history of recurrent syncope.  History reviewed. No pertinent past medical history. Past Surgical History  Procedure Laterality Date  . Foot surgery     History reviewed. No pertinent family history. History  Substance Use Topics  . Smoking status: Never Smoker   . Smokeless tobacco: Never Used  . Alcohol Use: No   OB History    No data available     Review of Systems  All systems reviewed and negative, other than as noted in HPI.   Allergies  Review of patient's allergies indicates no known allergies.  Home Medications   Prior to Admission medications   Medication Sig Start Date End Date Taking? Authorizing Provider  acetaminophen (TYLENOL) 500 MG tablet Take 1,000 mg by mouth every 6 (six) hours as needed for moderate pain or headache.   Yes Historical Provider, MD  B Complex Vitamins (B COMPLEX PO) Take 1 tablet by mouth daily.   Yes Historical Provider, MD  Elderberry 575 MG/5ML SYRP Take 10 mLs by mouth once.   Yes Historical Provider, MD  Phenylephrine-Pheniramine-DM [facility] COLD & COUGH PO) Take 30 mLs by mouth every 4 (four) hours as needed (flu  symptoms).   Yes Historical Provider, MD  predniSONE (DELTASONE) 10 MG tablet Take 3 tablets per day for 2 days, then 2 tablets per day for 2 days, then 1 tablet per day for 2 days. Patient not taking: Reported on 09/29/2014 03/30/12   Carleene Cooper, MD   BP 125/72 mmHg  Pulse 96  Temp(Src) 97.8 F (36.6 C) (Oral)  Resp 16  SpO2 100% Physical Exam  Constitutional: She appears well-developed and well-nourished. No distress.  HENT:  Head: Normocephalic and atraumatic.  Eyes: Conjunctivae are normal. Right eye exhibits no discharge. Left eye exhibits no discharge.  Neck: Neck supple.  Cardiovascular: Normal rate, regular rhythm and normal heart sounds.  Exam reveals no gallop and no friction rub.   No murmur heard. Pulmonary/Chest: Effort normal and breath sounds normal. No respiratory distress.  Abdominal: Soft. She exhibits no distension. There is no tenderness.  Musculoskeletal: She exhibits no edema or tenderness.  Lower extremities symmetric as compared to each other. No calf tenderness. Negative Homan's. No palpable cords.   Neurological: She is alert.  Skin: Skin is warm and dry.  Psychiatric: She has a normal mood and affect. Her behavior is normal. Thought content normal.  Nursing note and vitals reviewed.   ED Course  Procedures (including critical care time) Labs Review Labs Reviewed  CBC WITH DIFFERENTIAL/PLATELET - Abnormal; Notable for the following:    Monocytes Relative 14 (*)    All other components within normal limits  BASIC METABOLIC PANEL  TROPONIN I  Imaging Review No results found.   EKG Interpretation   Date/Time:  Wednesday September 29 2014 09:23:12 EDT Ventricular Rate:  80 PR Interval:  143 QRS Duration: 73 QT Interval:  401 QTC Calculation: 463 R Axis:   85 Text Interpretation:  Sinus rhythm ST elevation suggests acute  pericarditis since last tracing no significant change Confirmed by Juleen China   MD, Taygan Connell ([id number]) on 09/29/2014 9:30:52 AM       MDM   Final diagnoses:  Near syncope    \    Raeford Razor, MD 10/08/14 1836

## 2014-10-20 ENCOUNTER — Observation Stay (HOSPITAL_COMMUNITY)
Admission: EM | Admit: 2014-10-20 | Discharge: 2014-10-21 | Disposition: A | Discharge status: Home / Self Care | Payer: 59 | Attending: Oncology | Admitting: Oncology

## 2014-10-20 ENCOUNTER — Emergency Department (HOSPITAL_COMMUNITY): Payer: 59

## 2014-10-20 ENCOUNTER — Encounter (HOSPITAL_COMMUNITY): Payer: Self-pay | Admitting: *Deleted

## 2014-10-20 DIAGNOSIS — R55 Syncope and collapse: Secondary | ICD-10-CM | POA: Diagnosis not present

## 2014-10-20 DIAGNOSIS — R0789 Other chest pain: Secondary | ICD-10-CM | POA: Diagnosis not present

## 2014-10-20 DIAGNOSIS — G8929 Other chronic pain: Secondary | ICD-10-CM | POA: Diagnosis not present

## 2014-10-20 DIAGNOSIS — M542 Cervicalgia: Secondary | ICD-10-CM | POA: Diagnosis present

## 2014-10-20 DIAGNOSIS — M79602 Pain in left arm: Secondary | ICD-10-CM | POA: Diagnosis not present

## 2014-10-20 DIAGNOSIS — Z79899 Other long term (current) drug therapy: Secondary | ICD-10-CM | POA: Insufficient documentation

## 2014-10-20 DIAGNOSIS — M25519 Pain in unspecified shoulder: Secondary | ICD-10-CM | POA: Diagnosis present

## 2014-10-20 DIAGNOSIS — K219 Gastro-esophageal reflux disease without esophagitis: Secondary | ICD-10-CM | POA: Diagnosis present

## 2014-10-20 DIAGNOSIS — R079 Chest pain, unspecified: Principal | ICD-10-CM | POA: Insufficient documentation

## 2014-10-20 DIAGNOSIS — R5383 Other fatigue: Secondary | ICD-10-CM | POA: Insufficient documentation

## 2014-10-20 HISTORY — DX: Dizziness and giddiness: R42

## 2014-10-20 HISTORY — DX: Unspecified osteoarthritis, unspecified site: M19.90

## 2014-10-20 HISTORY — DX: Fibromyalgia: M79.7

## 2014-10-20 LAB — TROPONIN I

## 2014-10-20 LAB — BASIC METABOLIC PANEL
ANION GAP: 7 (ref 5–15)
BUN: 8 mg/dL (ref 6–20)
CALCIUM: 9.4 mg/dL (ref 8.9–10.3)
CO2: 25 mmol/L (ref 22–32)
Chloride: 107 mmol/L (ref 101–111)
Creatinine, Ser: 0.77 mg/dL (ref 0.44–1.00)
GFR calc Af Amer: >60 mL/min (ref >60)
Glucose, Bld: 104 mg/dL — ABNORMAL HIGH (ref 65–99)
Potassium: 4.1 mmol/L (ref 3.5–5.1)
SODIUM: 139 mmol/L (ref 135–145)

## 2014-10-20 LAB — URINE MICROSCOPIC-ADD ON

## 2014-10-20 LAB — URINALYSIS, ROUTINE W REFLEX MICROSCOPIC
Bilirubin Urine: NEGATIVE
Glucose, UA: NEGATIVE mg/dL
Hgb urine dipstick: NEGATIVE
Ketones, ur: NEGATIVE mg/dL
Nitrite: NEGATIVE
Protein, ur: NEGATIVE mg/dL
Specific Gravity, Urine: 1.024 (ref 1.005–1.030)
Urobilinogen, UA: 0.2 mg/dL (ref 0.0–1.0)
pH: 5 (ref 5.0–8.0)

## 2014-10-20 LAB — CBC WITH DIFFERENTIAL/PLATELET
Basophils Absolute: 0 K/uL (ref 0.0–0.1)
Basophils Relative: 0 % (ref 0–1)
Eosinophils Absolute: 0 K/uL (ref 0.0–0.7)
Eosinophils Relative: 1 % (ref 0–5)
HCT: 35.7 % — ABNORMAL LOW (ref 36.0–46.0)
Hemoglobin: 11.9 g/dL — ABNORMAL LOW (ref 12.0–15.0)
Lymphocytes Relative: 48 % — ABNORMAL HIGH (ref 12–46)
Lymphs Abs: 2 K/uL (ref 0.7–4.0)
MCH: 30.1 pg (ref 26.0–34.0)
MCHC: 33.3 g/dL (ref 30.0–36.0)
MCV: 90.4 fL (ref 78.0–100.0)
Monocytes Absolute: 0.3 K/uL (ref 0.1–1.0)
Monocytes Relative: 7 % (ref 3–12)
Neutro Abs: 1.8 K/uL (ref 1.7–7.7)
Neutrophils Relative %: 44 % (ref 43–77)
Platelets: 206 K/uL (ref 150–400)
RBC: 3.95 MIL/uL (ref 3.87–5.11)
RDW: 12.9 % (ref 11.5–15.5)
WBC: 4.1 K/uL (ref 4.0–10.5)

## 2014-10-20 LAB — RAPID URINE DRUG SCREEN, HOSP PERFORMED
Amphetamines: NOT DETECTED
Barbiturates: NOT DETECTED
Benzodiazepines: NOT DETECTED
Cocaine: NOT DETECTED
Opiates: NOT DETECTED
Tetrahydrocannabinol: NOT DETECTED

## 2014-10-20 LAB — LIPID PANEL
Cholesterol: 144 mg/dL (ref 0–200)
HDL: 68 mg/dL (ref >40)
LDL CALC: 70 mg/dL (ref 0–99)
Total CHOL/HDL Ratio: 2.1 RATIO
Triglycerides: 28 mg/dL (ref <150)
VLDL: 6 mg/dL (ref 0–40)

## 2014-10-20 LAB — I-STAT TROPONIN, ED: Troponin i, poc: 0 ng/mL (ref 0.00–0.08)

## 2014-10-20 MED ORDER — SODIUM CHLORIDE 0.9 % IV BOLUS (SEPSIS)
1000.0000 mL | Freq: Once | INTRAVENOUS | Status: AC
  Administered 2014-10-20: 1000 mL via INTRAVENOUS

## 2014-10-20 MED ORDER — GI COCKTAIL ~~LOC~~: 30.0000 mL | Freq: Four times a day (QID) | ORAL | Status: DC | PRN

## 2014-10-20 MED ORDER — ACETAMINOPHEN 325 MG PO TABS: 650.0000 mg | ORAL_TABLET | ORAL | Status: DC | PRN

## 2014-10-20 MED ORDER — SODIUM CHLORIDE 0.9 % IV SOLN
INTRAVENOUS | Status: DC
  Administered 2014-10-21: 01:00:00 via INTRAVENOUS

## 2014-10-20 MED ORDER — B COMPLEX-C PO TABS
1.0000 | ORAL_TABLET | Freq: Every day | ORAL | Status: DC
  Administered 2014-10-20 – 2014-10-21 (×2): 1 via ORAL
  Filled 2014-10-20 (×3): qty 1

## 2014-10-20 MED ORDER — B COMPLEX PO TABS
1.0000 | ORAL_TABLET | Freq: Every day | ORAL | Status: DC
  Filled 2014-10-20: qty 1

## 2014-10-20 MED ORDER — ENOXAPARIN SODIUM 40 MG/0.4ML ~~LOC~~ SOLN: 40.0000 mg | SUBCUTANEOUS | Status: DC

## 2014-10-20 NOTE — ED Notes (Signed)
Yellow socks and yellow arm band placed on patient. 

## 2014-10-20 NOTE — ED Notes (Signed)
Patient transported to x-ray. ?

## 2014-10-20 NOTE — H&P (Signed)
Date: 10/20/2014               Patient Name:  Jacqueline Bray MRN: [id number]  DOB: 04/20/1966 Age / Sex: 49 y.o., female   PCP: No primary care provider on file.              Medical Service: Internal Medicine Teaching Service              Attending Physician: Dr. Levert Feinstein, MD    First Contact: Sandrea Hammond, MS 4 Pager: [phone]  Second Contact: Dr. Delane Ginger  Pager: [phone])       After Hours (After 5p/  First Contact Pager: [phone]  weekends / holidays): Second Contact Pager: [phone]   Chief Complaint: chest pain with syncope  History of Present Illness:  Jacqueline Bray is a 49 yo F with no significant past medical history who present after experiencing chest pain and a syncopal episode earlier today at work. The chest pain started at around 10 am while she was sitting during her break. The pains started in the epigastrium and then became substernal. She describes it as "extreme chest tightness" and pressure that was a 10/10, radiated to her neck, and resolved spontaneously after 10 minutes. She stood up and tried to walk, but felt hot and weak, and lost consciousness. She reports that a coworker caught her on the way down. She denies hitting her head. The episode only lasted for a couple of seconds. No seizure activity was noted. She denies any n/v, palpitations, or diaphoresis during the episode. She recovered immediately without any neurological sequelae. She had a similar syncopal episode 2 weeks ago, without associated chest pain. She had an unremarkable workup in the ED at [facility] at that time. 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   She reports that she has had intermittent, sporadic, unprovoked episodes of chest pain for the past year. She also noticed that she has been getting lightheaded when she worked out on the treadmill over the same time period. She has also been fatigued. She denies a  family history of sudden cardiac death, she has no personal history of heart disease or seizure disorder. She reports a past history of vertigo treated with meclizine, but says that these two episodes are different from her past vertigo symptoms. She reports a history of one panic attack in the 90s. She denies fevers, chills, nausea, vomiting, SOB, hemoptysis, LLE pain, weakness or numbness.   In the ED, she received a 1L NS bolus. EKG, CXR on admission were unremarkable. Initial troponin was negative. CBC, BMP, UA were wnl. She was admitted for further evaluation.  Meds: Current Facility-Administered Medications  Medication Dose Route Frequency Provider Last Rate Last Dose  . acetaminophen (TYLENOL) tablet 650 mg  650 mg Oral Q4H PRN Marrian Salvage, MD      . b complex vitamins tablet 1 tablet  1 tablet Oral Daily Marrian Salvage, MD      . enoxaparin (LOVENOX) injection 40 mg  40 mg Subcutaneous Q24H Marrian Salvage, MD   40 mg at 10/20/14 1745  . gi cocktail (Maalox,Lidocaine,Donnatal)  30 mL Oral QID PRN Marrian Salvage, MD        Allergies: Allergies as of 10/20/2014 - Review Complete 10/20/2014  Allergen Reaction Noted  . Hydrocodone Other (See Comments) 10/20/2014   History reviewed. No pertinent past medical history. Past Surgical History  Procedure  Laterality Date  . Foot surgery     No family history on file. History   Social History  . Marital Status: Single    Spouse Name: N/A  . Number of Children: N/A  . Years of Education: N/A   Occupational History  . Not on file.   Social History Main Topics  . Smoking status: Never Smoker   . Smokeless tobacco: Never Used  . Alcohol Use: No  . Drug Use: No  . Sexual Activity: Yes    Birth Control/ Protection: None   Other Topics Concern  . Not on file   Social History Narrative   The patient works in [employer]. She is a never smoker, drinks socially, and denies any other drug use.   Review of  Systems:  General: reports fatigue, lightheadedness on exertion, reports night sweats x 1 year, denies fevers, chills, denies weight loss  CV: reports intermittent, unprovoked chest pains, denies palpitations, PND, dyspnea on exertion, orthopnea Respiratory: denies SOB, cough, hemoptysis GI: denies nausea, vomiting, diarrhea, constipation, abdominal pain MSK: reports a history of morning joint stiffness and joint pain, currently denies myalgias or arthralgias  Skin: denies rashes, denies easy bruising Neuro: denies HA, weakness  Physical Exam: Blood pressure 110/66, pulse 83, temperature 98.4 F (36.9 C), temperature source Oral, resp. rate 16, height 5\' 6"  (1.676 m), weight 66.724 kg (147 lb 1.6 oz), last menstrual period 12/30/2011, SpO2 100 %. BP 110/66 mmHg  Pulse 83  Temp(Src) 98.4 F (36.9 C) (Oral)  Resp 16  Ht 5\' 6"  (1.676 m)  Wt 66.724 kg (147 lb 1.6 oz)  BMI 23.75 kg/m2  SpO2 100%  LMP 12/30/2011  General Appearance:    Alert, cooperative, no distress  Head:    Normocephalic, without obvious abnormality, atraumatic  Eyes:    PERRL, conjunctiva/corneas clear,    Nose:   Nares normal, septum midline, mucosa normal, no drainage    or sinus tenderness  Throat:   Lips, mucosa, and tongue normal; teeth and gums normal  Neck:   Supple, symmetrical, trachea midline, no adenopathy;    thyroid:  no enlargement/tenderness/nodules; no carotid   bruit or JVD  Back:     Symmetric, no curvature, ROM normal, no CVA tenderness  Lungs:     Clear to auscultation bilaterally, respirations unlabored  Chest Wall:    No tenderness or deformity   Heart:    Regular rate and rhythm, S1 and S2 normal, 2/6 blowing holosystolic murmur best heard at RSB, no rubs or gallops  Abdomen:     Soft, non-tender, bowel sounds active all four quadrants,    no masses, no organomegaly  Extremities:   Extremities normal, atraumatic, no cyanosis or edema  Pulses:   2+ and symmetric all extremities  Skin:    Skin color, texture, turgor normal, no rashes or lesions  Lymph nodes:   Cervical, supraclavicular nodes normal  Neurologic:   5/5 strength, sensation throughout, no focal abnormalities    Lab results: Basic Metabolic Panel:  Recent Labs  28/76/81 1130  NA 139  K 4.1  CL 107  CO2 25  GLUCOSE 104*  BUN 8  CREATININE 0.77  CALCIUM 9.4   CBC:  Recent Labs  10/20/14 1130  WBC 4.1  NEUTROABS 1.8  HGB 11.9*  HCT 35.7*  MCV 90.4  PLT 206   Urine Drug Screen: Drugs of Abuse     Component Value Date/Time   LABOPIA NONE DETECTED 10/20/2014 1208   COCAINSCRNUR NONE DETECTED 10/20/2014  1208   LABBENZ NONE DETECTED 10/20/2014 1208   AMPHETMU NONE DETECTED 10/20/2014 1208   THCU NONE DETECTED 10/20/2014 1208   LABBARB NONE DETECTED 10/20/2014 1208    Urinalysis:  Recent Labs  10/20/14 1208  COLORURINE YELLOW  LABSPEC 1.024  PHURINE 5.0  GLUCOSEU NEGATIVE  HGBUR NEGATIVE  BILIRUBINUR NEGATIVE  KETONESUR NEGATIVE  PROTEINUR NEGATIVE  UROBILINOGEN 0.2  NITRITE NEGATIVE  LEUKOCYTESUR TRACE*   Imaging results:  Dg Chest 2 View  10/20/2014   CLINICAL DATA:  Initial encounter for chest pain  EXAM: CHEST  2 VIEW  COMPARISON:  03/30/2012.  FINDINGS: The heart size and mediastinal contours are within normal limits. Both lungs are clear. The visualized skeletal structures are unremarkable.  IMPRESSION: No active cardiopulmonary disease.   Electronically Signed   By: Kennith Center M.D.   On: 10/20/2014 12:06    Other results: EKG:  Date/Time: Wednesday October 20 2014 11:16:57 EDT Ventricular Rate: 87 PR Interval: 146 QRS Duration: 70 QT Interval: 402 QTC Calculation: 484 R Axis: 83 Text Interpretation: Sinus rhythm Biatrial enlargement RSR' in V1 or V2,  probably normal variant ST elev, probable normal early repol pattern  Baseline wander in lead(s) II III aVR aVL aVF since last tracing no  significant change Confirmed by MILLER MD, BRIAN ([id number]) on  10/20/2014  12:25:22 PM  Assessment & Plan by Problem: Principal Problem:   Chest pain Active Problems:   GERD  This is a 49 yo F with no PMH who presents after experiencing a syncopal episode and chest pain.   Chest pain with syncope Patient presents after a short episode of syncope, preceded by an episode of atypical pressure like chest pain. She had a similar syncopal episode two weeks ago, with unremarkable work up. The patient recovered spontaneously without any neurological sequelae. On exam, the patient is afebrile and normotensive. Cardiac exam unremarkable, with regular rate and rhythm. EKG in the ED was unremarkable, unchanged from prior. CXR without evidence of any acute cardiopulmonary process. Initial troponin was negative. CBC, CMP, UA were all wnl. The patient recurrent episodes of syncope and chest pain are most likely cardiac in etiology, such as an arrhytmia vs valvular abnormality. Ischemia is less likely, given negative EKG, negative initial troponin, and no risk factors. Orthostatics were not obtained since patient already bolused in the ED. Vasovagal syncope is also a possibility, given prodrome of warmth prior to loss of consciousness, but pt has no prior history of syncope, and chest pain is certainly concerning for cardiac causes.  - admit to telemetry - trend troponins  - cardiology consult in the am  - echocardiogram   DVT ppx - lovenox   FENGI:  -heart healthy  - replete electrolytes as needed   Dispo: Admit to IM Teaching Service for observation. Anticipated discharge in 1-2 days.   This is a Psychologist, occupational Note.  The care of the patient was discussed with Dr. Delane Ginger and the assessment and plan was formulated with their assistance.  Please see their note for official documentation of the patient encounter.   Signed: Sandrea Hammond, Med Student 10/20/2014, 6:38 PM

## 2014-10-20 NOTE — ED Notes (Signed)
Pt in from work via [facility], pt reports feeling mid non radiating CP onset today while standing in the breakroom at work, the pt returned to her work area & had witnessed syncopal from standing position, unknown time down, moves all extremities, denies neck & back pain, pt A&O x4, follows commands, speaks in complete sentences, pt states, "Someone caught me.", pt reports hx of the same x 2 wks ago & was evaluated at [facility] [facility]

## 2014-10-20 NOTE — ED Provider Notes (Signed)
The patient is a 49 year old female, she presents with a complaint of chest discomfort and syncope, this happened 2 weeks ago as well. Both of these episodes happened at work. She is now symptom-free. There was no radiation to the back to shoulder the arm the jaw or the neck. She denies any swelling of the legs, has no risk factors for pulmonary embolism, has no history of cardiac disease. She does not exercise but has no exertional symptoms and her past. On exam the patient has clear heart and lung sounds, no murmur, EKG is unremarkable, lab work looks normal, the patient is nonacute appearing, she is symptom free, we'll arrange outpatient stress test. I discussed with the patient at length indications for return, she is agreeable.  Medical screening examination/treatment/procedure(s) were conducted as a shared visit with non-physician practitioner(s) and myself.  I personally evaluated the patient during the encounter.  Clinical Impression:   Final diagnoses:  Syncope and collapse  Chest pain, unspecified chest pain type         Eber Hong, MD 10/20/14 9390

## 2014-10-20 NOTE — H&P (Signed)
Date: 10/20/2014               Patient Name:  Jacqueline Bray MRN: [id number]  DOB: [birth year] Age / Sex: 49 y.o., female   PCP: Loyal Jacobson, MD           Medical Service: Internal Medicine Teaching Service         Attending Physician: Dr. Levert Feinstein, MD    First Contact: Sandrea Hammond, MSIV Pager: [phone]  Second Contact: Dr. Boykin Peek, MD Pager: [phone]        After Hours (After 5p/  First Contact Pager: [phone]  weekends / holidays): Second Contact Pager: [phone]    Most Recent Discharge Date:  09/29/14  Chief Complaint:  Chief Complaint  Patient presents with  . Chest Pain       History of Present Illness:  Jacqueline Bray is a 49 y.o. female who has a past medical history of Arthritis; Fibromyalgia (dx'd 1991); and Lightheadedness.Marland Kitchen  Pt presents to the ED with chest pain and syncope.    Pt states that while she was at work this AM she was sitting in the chair and began having epigastric chest pain that radiated up into her chest.  She describes the pain as "discomfort" and "tightness".  States it lasted about 5 minutes and subsided within 5 minutes when she stood up.  She states it happened to her previously once before.  Also endorses diaphoresis and dizziness.  Denies exertional CP, nausea, or vomiting.  Denies any h/o GERD.  Denies h/o seizures.  No tongue biting or loss of bowel or bladder control.  LMP was 2 years ago.  Also without changes in appetite and has been eating and drinking normally.  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  Denies cigarette use or other recreational drug use.  Drinks a mixed drink one and a while.    Of note, she is requesting to have a stress test.  In the ED, she was given a 1L NS bolus.  EKG was without acute changes and i-stat and trop x 1 neg.  UDS neg.     Meds:  Prescriptions prior to admission  Medication Sig Dispense Refill Last Dose  . acetaminophen (TYLENOL) 500 MG  tablet Take 1,000 mg by mouth every 6 (six) hours as needed for moderate pain or headache.   Past Month at Unknown time  . B Complex Vitamins (B COMPLEX PO) Take 1 tablet by mouth daily.   Past Week at Unknown time  . Phenylephrine-Pheniramine-DM (THERAFLU COLD & COUGH PO) Take 30 mLs by mouth every 4 (four) hours as needed (flu symptoms).   Past Month at Unknown time  . predniSONE (DELTASONE) 10 MG tablet Take 3 tablets per day for 2 days, then 2 tablets per day for 2 days, then 1 tablet per day for 2 days. (Patient not taking: Reported on 09/29/2014) 12 tablet 0 Completed Course at Unknown time   Allergies: Allergies as of 10/20/2014 - Review Complete 10/20/2014  Allergen Reaction Noted  . Hydrocodone Other (See Comments) 10/20/2014    PMH: Past Medical History  Diagnosis Date  . Arthritis     "joint stiffness" (10/20/2014)  . Fibromyalgia dx'd 1991  . Lightheadedness     "chronic; like I'm walking around in a fog; activity makes it worse" (10/20/2014)    PSH: Past Surgical History  Procedure Laterality Date  . Hammer toe surgery Bilateral  1990's  . Dilation and curettage of uterus  ~ 2004    S/P miscarriage    FH: History reviewed. No pertinent family history.  SH: History  Substance Use Topics  . Smoking status: Never Smoker   . Smokeless tobacco: Never Used  . Alcohol Use: Yes     Comment: 10/20/2014 "might have 2 drinks/month"    Review of Systems: Pertinent items are noted in HPI.  Physical Exam: BP 108/51 mmHg  Pulse 85  Temp(Src) 97.7 F (36.5 C) (Oral)  Resp 16  Ht  (1.676 m)  Wt 147 lb 1.6 oz (66.724 kg)  BMI 23.75 kg/m2  SpO2 99%  LMP 12/30/2011  Physical Exam  Constitutional: Vital signs reviewed.  Patient is a well-developed and well-nourished female in no acute distress and cooperative with exam.  Head: Normocephalic and atraumatic Eyes: EOMI, conjunctivae normal, no scleral icterus.  Neck: Supple, Trachea midline. Cardiovascular: RRR,  murmur; no pulses symmetric and intact bilaterally Pulmonary/Chest: normal respiratory effort, CTAB, no wheezes, rales, or rhonchi Abdominal: Soft. Non-tender, non-distended, bowel sounds are normal, no masses, organomegaly, or guarding present.  Neurological: A&O x3, cranial nerve II-XII are grossly intact, no focal motor deficits. Skin: Warm, dry and intact. No rash, cyanosis, or clubbing.  Psychiatric: Appears anxious.     Lab results:  Basic Metabolic Panel:  Recent Labs  19/14/78 1130  NA 139  K 4.1  CL 107  CO2 25  GLUCOSE 104*  BUN 8  CREATININE 0.77  CALCIUM 9.4    Calcium/Magnesium/Phosphorus:  Recent Labs Lab 10/20/14 1130  CALCIUM 9.4    Liver Function Tests: No results for input(s): AST, ALT, ALKPHOS, BILITOT, PROT, ALBUMIN in the last 72 hours. No results for input(s): LIPASE, AMYLASE in the last 72 hours. No results for input(s): AMMONIA in the last 72 hours.  CBC: Lab Results  Component Value Date   WBC 4.1 10/20/2014   HGB 11.9* 10/20/2014   HCT 35.7* 10/20/2014   MCV 90.4 10/20/2014   PLT 206 10/20/2014    Lipase: No results found for: LIPASE  Lactic Acid/Procalcitonin: No results for input(s): LATICACIDVEN, PROCALCITON, O2SATVEN in the last 168 hours.  Cardiac Enzymes:  Recent Labs  10/20/14 1137  TROPIPOC 0.00   Lab Results  Component Value Date   TROPONINI <0.03 10/20/2014    BNP: No results for input(s): PROBNP in the last 72 hours.  D-Dimer: No results for input(s): DDIMER in the last 72 hours.  CBG: No results for input(s): GLUCAP in the last 72 hours.  Hemoglobin A1C: No results for input(s): HGBA1C in the last 72 hours.  Lipid Panel:  Recent Labs  10/20/14 1842  CHOL 144  HDL 68  LDLCALC 70  TRIG 28  CHOLHDL 2.1    Thyroid Function Tests: No results for input(s): TSH, T4TOTAL, FREET4, T3FREE, THYROIDAB in the last 72 hours.  Anemia Panel: No results for input(s): VITAMINB12, FOLATE, FERRITIN, TIBC,  IRON, RETICCTPCT in the last 72 hours.  Coagulation: No results for input(s): LABPROT, INR in the last 72 hours.  Urine Drug Screen: Drugs of Abuse:     Component Value Date/Time   LABOPIA NONE DETECTED 10/20/2014 1208   COCAINSCRNUR NONE DETECTED 10/20/2014 1208   LABBENZ NONE DETECTED 10/20/2014 1208   AMPHETMU NONE DETECTED 10/20/2014 1208   THCU NONE DETECTED 10/20/2014 1208   LABBARB NONE DETECTED 10/20/2014 1208    Alcohol Level: No results for input(s): ETH in the last 72 hours.  Urinalysis:    Component  Value Date/Time   COLORURINE YELLOW 10/20/2014 1208   APPEARANCEUR CLEAR 10/20/2014 1208   LABSPEC 1.024 10/20/2014 1208   PHURINE 5.0 10/20/2014 1208   GLUCOSEU NEGATIVE 10/20/2014 1208   HGBUR NEGATIVE 10/20/2014 1208   HGBUR trace-intact 01/24/2009 1502   BILIRUBINUR NEGATIVE 10/20/2014 1208   KETONESUR NEGATIVE 10/20/2014 1208   PROTEINUR NEGATIVE 10/20/2014 1208   UROBILINOGEN 0.2 10/20/2014 1208   NITRITE NEGATIVE 10/20/2014 1208   LEUKOCYTESUR TRACE* 10/20/2014 1208    Imaging results:  Dg Chest 2 View  10/20/2014   CLINICAL DATA:  Initial encounter for chest pain  EXAM: CHEST  2 VIEW  COMPARISON:  03/30/2012.  FINDINGS: The heart size and mediastinal contours are within normal limits. Both lungs are clear. The visualized skeletal structures are unremarkable.  IMPRESSION: No active cardiopulmonary disease.   Electronically Signed   By: Kennith Center M.D.   On: 10/20/2014 12:06    EKG: EKG Interpretation  Date/Time:  Wednesday October 20 2014 11:16:57 EDT Ventricular Rate:  87 PR Interval:  146 QRS Duration: 70 QT Interval:  402 QTC Calculation: 484 R Axis:   83 Text Interpretation:  Sinus rhythm Biatrial enlargement RSR' in V1 or V2, probably normal variant ST elev, probable normal early repol pattern Baseline wander in lead(s) II III aVR aVL aVF since last tracing no significant change Confirmed by MILLER  MD, BRIAN ([id number]) on 10/20/2014 12:25:22  PM   Antibiotics: Antibiotics Given (last 72 hours)    None      Anti-infectives    None     Consults:    Assessment & Plan by Problem: Principal Problem:   Chest pain Active Problems:   GERD   Pt is a 49 y.o.  has a past medical history of Arthritis; Fibromyalgia (dx'd 1991); and Lightheadedness. presents to ED with chest pain and syncope.   Patient Active Problem List   Diagnosis Date Noted  . Chest pain 10/20/2014  . SHOULDER PAIN, LEFT 04/05/2010  . GERD 01/24/2009  . MICROSCOPIC HEMATURIA 01/24/2009  . PAIN IN JOINT, MULTIPLE SITES 01/24/2009  . NECK PAIN, CHRONIC 01/24/2009  . FIBROCYSTIC BREAST DISEASE 01/08/2008   Atypical Chest Pain  Pt has no risk factors for ACS.  TIMI score: 0.  Other diagnoses considered include: PE, but not tachycardic or hypoxic.  Aortic dissection (pain not described as acute, tearing/ripping, no widening of mediastinum on CXR), Pericarditis (no pleuritic CP, diffuse ST elevation on EKG, or friction rub), GI etiology (denies symptoms of dyspepsia, regurgitation, acid taste, no h/o PPI use), Anxiety (pt denies any recent stressors, no h/o).  Pt does appear to be very anxious which may also be contributory.  CXR wnl.  Trop x 1 neg.  I-stat trop neg.  EKG without acute changes.   -admit to observation (telemetry) -troponins x 3 -morphine & NTG prn -lipid panel -EKG in AM and prn chest pain -TTE in AM  -consult cards in AM   Syncope  Pt experienced syncope while at work getting up out of her chair to speak with her boss.  Has experienced a couple of episodes in the past.  No h/o seizures.  No cardiac history. No aura.  She was given IVF in the ED prior to orthostatics being taken.  Likely vasovagal given association with prodrome of dizziness, lightheadedness, and diaphoresis.   -admit to telemetry -consider event monitor  -check orthostatics -IVF x 10hr  FEN  Fluids- None  Electrolytes- None Nutrition- Heart healthy  VTE  prophylaxis  lovenox 40mg  SQ qd  Disposition Disposition deferred at this time, awaiting improvement of current medical problems. Anticipated discharge in approximately 1-2 day(s).    Emergency Contact Contact Information    Name Relation Home Work Mobile   [place] Daughter   [phone]      The patient does have a current PCP (No primary care provider on file.) and does need an [facility] hospital follow-up appointment after discharge.  Signed Marrian Salvage, MD PGY-2, Internal Medicine Teaching Service 10/20/2014, 8:45 PM

## 2014-10-20 NOTE — ED Provider Notes (Signed)
CSN: [id number]     Arrival date & time 10/20/14  1112 History   First MD Initiated Contact with Patient 10/20/14 1112     Chief Complaint  Patient presents with  . Chest Pain   Jacqueline Bray is a 49 y.o. female who is otherwise healthy who presents to the emergency department after a syncopal episode while at work today. Patient reports that during her break at 10 AM she started having substernal chest tightness while sitting down. She reports this is slowly resolving and when she stood up to end her break she became very lightheaded and then had a syncopal episode from a standing position. The patient reports she was caught by a bystander did not fall or hit her head. Denies seizure like activity. Syncope lasted for seconds. Currently she complains of fatigue and reports her chest pain has currently resolved. She does not feel lightheaded at rest currently. The patient denies personal history of MI. The patient reports she had a similar near syncopal episode approximately 12 days ago with an unremarkable workup in the emergency department. She reports she does not exercise regularly, but she has noticed chest pain with exertion recently. The patient denies fevers, chills, anxiety, leg pain, abdominal pain, nausea, vomiting, diarrhea, urinary symptoms, changes to her vision, headache, numbness, tingling, weakness, rashes or changes to her medications. The patient denies personal or close family history of MI. The patient denies personal or close family history of DVTs or PEs. The patient denies personal or close family history of blood clotting disorders such as factor V Leiden, protein C or S deficiency. The patient denies endogenous estrogen use, recent surgeries, recent long travel or hemoptysis.  (Consider location/radiation/quality/duration/timing/severity/associated sxs/prior Treatment) HPI  History reviewed. No pertinent past medical history. Past Surgical History  Procedure Laterality Date   . Foot surgery     No family history on file. History  Substance Use Topics  . Smoking status: Never Smoker   . Smokeless tobacco: Never Used  . Alcohol Use: No   OB History    No data available     Review of Systems  Constitutional: Positive for fatigue. Negative for fever, chills and unexpected weight change.  HENT: Negative for congestion, ear pain, hearing loss, sore throat and trouble swallowing.   Eyes: Negative for pain and visual disturbance.  Respiratory: Negative for cough, shortness of breath and wheezing.   Cardiovascular: Positive for chest pain. Negative for palpitations and leg swelling.  Gastrointestinal: Negative for nausea, vomiting, abdominal pain and diarrhea.  Genitourinary: Negative for dysuria, urgency, frequency, hematuria and difficulty urinating.  Musculoskeletal: Negative for back pain and neck pain.  Skin: Negative for rash.  Neurological: Positive for light-headedness. Negative for dizziness, syncope, weakness, numbness and headaches.      Allergies  Review of patient's allergies indicates no known allergies.  Home Medications   Prior to Admission medications   Medication Sig Start Date End Date Taking? Authorizing Provider  acetaminophen (TYLENOL) 500 MG tablet Take 1,000 mg by mouth every 6 (six) hours as needed for moderate pain or headache.    Historical Provider, MD  B Complex Vitamins (B COMPLEX PO) Take 1 tablet by mouth daily.    Historical Provider, MD  Elderberry 575 MG/5ML SYRP Take 10 mLs by mouth once.    Historical Provider, MD  Phenylephrine-Pheniramine-DM [facility] COLD & COUGH PO) Take 30 mLs by mouth every 4 (four) hours as needed (flu symptoms).    Historical Provider, MD  predniSONE (DELTASONE) 10  MG tablet Take 3 tablets per day for 2 days, then 2 tablets per day for 2 days, then 1 tablet per day for 2 days. Patient not taking: Reported on 09/29/2014 03/30/12   Carleene Cooper, MD   BP 126/76 mmHg  Pulse 81  Temp(Src) 98.2 F  (36.8 C) (Oral)  Resp 16  Ht  (1.676 m)  Wt 145 lb (65.772 kg)  BMI 23.41 kg/m2  SpO2 99%  LMP 12/30/2011 Physical Exam  Constitutional: She is oriented to person, place, and time. She appears well-developed and well-nourished. No distress.  Nontoxic appearing.  HENT:  Head: Normocephalic and atraumatic.  Right Ear: External ear normal.  Left Ear: External ear normal.  Mouth/Throat: Oropharynx is clear and moist. No oropharyngeal exudate.  Eyes: Conjunctivae and EOM are normal. Pupils are equal, round, and reactive to light. Right eye exhibits no discharge. Left eye exhibits no discharge.  Neck: Normal range of motion. Neck supple. No JVD present. No tracheal deviation present.  No midline neck tenderness.  Cardiovascular: Normal rate, regular rhythm, normal heart sounds and intact distal pulses.  Exam reveals no gallop and no friction rub.   No murmur heard. Bilateral radial, posterior tibialis and dorsalis pedis pulses are intact.    Pulmonary/Chest: Effort normal and breath sounds normal. No respiratory distress. She has no wheezes. She has no rales. She exhibits no tenderness.  Abdominal: Soft. Bowel sounds are normal. She exhibits no distension. There is no tenderness. There is no guarding.  Musculoskeletal: She exhibits no edema or tenderness.  No lower extremity edema or tenderness.  Lymphadenopathy:    She has no cervical adenopathy.  Neurological: She is alert and oriented to person, place, and time. No cranial nerve deficit. Coordination normal.  Cranial nerves are intact. No pronator drift. Finger to nose intact bilaterally. Sensation intact bilateral upper and lower extremities. Patient is alert and oriented 3.  Skin: Skin is warm and dry. No rash noted. She is not diaphoretic. No erythema. No pallor.  Psychiatric: She has a normal mood and affect. Her behavior is normal.  Nursing note and vitals reviewed.   ED Course  Procedures (including critical care  time) Labs Review Labs Reviewed  BASIC METABOLIC PANEL - Abnormal; Notable for the following:    Glucose, Bld 104 (*)    All other components within normal limits  CBC WITH DIFFERENTIAL/PLATELET - Abnormal; Notable for the following:    Hemoglobin 11.9 (*)    HCT 35.7 (*)    Lymphocytes Relative 48 (*)    All other components within normal limits  URINALYSIS, ROUTINE W REFLEX MICROSCOPIC (NOT AT [facility])  Rosezena Sensor, ED    Imaging Review Dg Chest 2 View  10/20/2014   CLINICAL DATA:  Initial encounter for chest pain  EXAM: CHEST  2 VIEW  COMPARISON:  03/30/2012.  FINDINGS: The heart size and mediastinal contours are within normal limits. Both lungs are clear. The visualized skeletal structures are unremarkable.  IMPRESSION: No active cardiopulmonary disease.   Electronically Signed   By: Kennith Center M.D.   On: 10/20/2014 12:06     EKG Interpretation   Date/Time:  Wednesday October 20 2014 11:16:57 EDT Ventricular Rate:  87 PR Interval:  146 QRS Duration: 70 QT Interval:  402 QTC Calculation: 484 R Axis:   83 Text Interpretation:  Sinus rhythm Biatrial enlargement RSR' in V1 or V2,  probably normal variant ST elev, probable normal early repol pattern  Baseline wander in lead(s) II III aVR aVL  aVF since last tracing no  significant change Confirmed by MILLER  MD, BRIAN ([id number]) on 10/20/2014  12:25:22 PM      Filed Vitals:   10/20/14 1115 10/20/14 1117 10/20/14 1118 10/20/14 1215  BP: 129/82  129/82 126/76  Pulse: 80  81   Temp:   98.2 F (36.8 C)   TempSrc:   Oral   Resp:   16 16  Height:  5\' 6"  (1.676 m)    Weight:  145 lb (65.772 kg)    SpO2: 100%  99%      MDM   Meds given in ED:  Medications  sodium chloride 0.9 % bolus 1,000 mL (0 mLs Intravenous Stopped 10/20/14 1424)    New Prescriptions   No medications on file    Final diagnoses:  Syncope and collapse  Chest pain, unspecified chest pain type    This is a 49 y.o. female who is otherwise healthy  who presents to the emergency department after a syncopal episode while at work today. Patient reports that during her break at 10 AM she started having substernal chest tightness while sitting down. She reports this is slowly resolving and when she stood up to end her break she became very lightheaded and then had a syncopal episode from a standing position. The patient reports she was caught by a bystander did not fall or hit her head. Denies seizure like activity. Syncope lasted for seconds. Currently she complains of fatigue and reports her chest pain has currently resolved. Further questioning the patient does report she is had exertional chest pain in the past but denies any today. Exam patient is afebrile and nontoxic appearing. The patient has no focal neurological deficits. Heart is regular rate and rhythm. EKG is unremarkable. Initial troponin is negative. Urinalysis is unremarkable. BMP is within normal limits. CBC shows stable hemoglobin and hematocrit. Chest x-ray is unremarkable. There is concern for possible cardiac etiology of this patient's pain and syncope. As the patient had exertional chest pain in the past I feel like the patient benefit from admission for ACS rule out and further cardiac workup like stress test. This patient is consulted for admission and accepted for admission by internal medicine service and Dr. Delane Ginger. The patient is in agreement with admission.   This patient was discussed with and evaluated by Dr. Hyacinth Meeker who agrees with assessment and plan.   Everlene Farrier, PA-C 10/20/14 1636  Eber Hong, MD 10/20/14 [phone]

## 2014-10-21 ENCOUNTER — Observation Stay (HOSPITAL_COMMUNITY): Payer: 59

## 2014-10-21 ENCOUNTER — Other Ambulatory Visit: Payer: Self-pay | Admitting: Physician Assistant

## 2014-10-21 ENCOUNTER — Observation Stay (HOSPITAL_BASED_OUTPATIENT_CLINIC_OR_DEPARTMENT_OTHER): Payer: 59

## 2014-10-21 DIAGNOSIS — R55 Syncope and collapse: Secondary | ICD-10-CM

## 2014-10-21 DIAGNOSIS — M25512 Pain in left shoulder: Secondary | ICD-10-CM

## 2014-10-21 DIAGNOSIS — G8929 Other chronic pain: Secondary | ICD-10-CM

## 2014-10-21 DIAGNOSIS — Z79899 Other long term (current) drug therapy: Secondary | ICD-10-CM | POA: Diagnosis not present

## 2014-10-21 DIAGNOSIS — R0789 Other chest pain: Secondary | ICD-10-CM | POA: Diagnosis not present

## 2014-10-21 DIAGNOSIS — M79602 Pain in left arm: Secondary | ICD-10-CM | POA: Diagnosis not present

## 2014-10-21 DIAGNOSIS — M542 Cervicalgia: Secondary | ICD-10-CM

## 2014-10-21 DIAGNOSIS — R5383 Other fatigue: Secondary | ICD-10-CM | POA: Diagnosis not present

## 2014-10-21 DIAGNOSIS — R079 Chest pain, unspecified: Secondary | ICD-10-CM | POA: Diagnosis not present

## 2014-10-21 LAB — TSH: TSH: 1.393 u[IU]/mL (ref 0.350–4.500)

## 2014-10-21 LAB — TROPONIN I
Troponin I: <0.03 ng/mL (ref <0.031)
Troponin I: <0.03 ng/mL (ref <0.031)

## 2014-10-21 LAB — HIV ANTIBODY (ROUTINE TESTING W REFLEX): HIV Screen 4th Generation wRfx: NONREACTIVE

## 2014-10-21 NOTE — Discharge Summary (Signed)
Patient Name: Jacqueline Bray  MRN:  [id number]   DOB: 1966/02/21   PCP: Loyal Jacobson, MD         Date of Admission: 10/20/2014  Date of Discharge: 10/21/2014        Attending Physician: Levert Feinstein, MD      DISCHARGE DIAGNOSES: Primary Chest pain, atypical Secondary Syncope  DISPOSITION AND FOLLOW-UP: Jacqueline Bray is to follow-up with the listed providers as detailed below, at which time, the following should be addressed:   1. Please continue outpatient work up for recurrent syncopal events  2. Labs / imaging needed at time of follow-up: none  3. Pending labs/ test needing follow-up: none    DISCHARGE INSTRUCTIONS: Follow-up Information    Follow up with Sid Falcon, MD.   Specialty:  Family Medicine   Why:  A hospital follow up appointment   Contact information:   [address] [place] [address] [phone]       Follow up with [facility] [address]   Why:  The office will call with an appointment for the Event Monitor    Contact information:   [address] [place] [address]       Follow up with Tereso Newcomer, PA-C On 12/01/2014.   Specialties:  Physician Assistant, Radiology, Interventional Cardiology   Why:  See to follow up Event Monitor. See provider at 10:10 am, please arrive 15 minutes early for paperwork.    Contact information:   [address] [place] [address] [phone]      Discharge Instructions    Diet - low sodium heart healthy    Complete by:  As directed      Discharge instructions    Complete by:  As directed   Please follow up with your primary physician and cardiology.     Increase activity slowly    Complete by:  As directed            DISCHARGE MEDICATIONS:   Medication List    STOP taking these medications        predniSONE 10 MG tablet  Commonly known as:  DELTASONE     THERAFLU COLD & COUGH PO      TAKE these  medications        acetaminophen 500 MG tablet  Commonly known as:  TYLENOL  Take 1,000 mg by mouth every 6 (six) hours as needed for moderate pain or headache.     B COMPLEX PO  Take 1 tablet by mouth daily.        CONSULTS:  Treatment Team:  [facility], MD Cassell Clement, MD    PROCEDURES PERFORMED:  Dg Chest 2 View  10/20/2014   CLINICAL DATA:  Initial encounter for chest pain  EXAM: CHEST  2 VIEW  COMPARISON:  03/30/2012.  FINDINGS: The heart size and mediastinal contours are within normal limits. Both lungs are clear. The visualized skeletal structures are unremarkable.  IMPRESSION: No active cardiopulmonary disease.   Electronically Signed   By: Kennith Center M.D.   On: 10/20/2014 12:06   EKG 10/21/2014 Normal sinus rhythm early repolarization No significant change since last tracing 10/20/14 Confirmed by [facility] MD, Fayrene Fearing ([id number]) on 10/21/2014 9:22:58 AM  Transthoracic Echocardiography 10/21/2014   ------------------------------------------------------------------- Study Conclusions  - Left ventricle: The cavity size was normal. Wall thickness was normal. Systolic function was normal. The estimated ejection fraction was in  the range of 55% to 60%. Wall motion was normal; there were no regional wall motion abnormalities. Doppler parameters are consistent with abnormal left ventricular relaxation (grade 1 diastolic dysfunction).  Transthoracic echocardiography. M-mode, complete 2D, spectral Doppler, and color Doppler. Birthdate: Patient birthdate: 22-Jan-1966. Age: Patient is 49 yr old. Sex: Gender: female. BMI: 23 kg/m^2. Blood pressure:   130/80 Patient status: Inpatient. Study date: Study date: 10/21/2014. Study time: 10:27 AM. Location: Bedside.  -------------------------------------------------------------------  ------------------------------------------------------------------- Left ventricle: The cavity size was normal.  Wall thickness was normal. Systolic function was normal. The estimated ejection fraction was in the range of 55% to 60%. Wall motion was normal; there were no regional wall motion abnormalities. Doppler parameters are consistent with abnormal left ventricular relaxation (grade 1 diastolic dysfunction).  ------------------------------------------------------------------- Aortic valve:  Structurally normal valve.  Cusp separation was normal. Doppler: Transvalvular velocity was within the normal range. There was no stenosis. There was no regurgitation.  ------------------------------------------------------------------- Aorta: The aorta was normal, not dilated, and non-diseased.  ------------------------------------------------------------------- Mitral valve:  Structurally normal valve.  Leaflet separation was normal. Doppler: Transvalvular velocity was within the normal range. There was no evidence for stenosis. There was no regurgitation.  Peak gradient (D): 3 mm Hg.  ------------------------------------------------------------------- Left atrium: The atrium was normal in size.  ------------------------------------------------------------------- Right ventricle: The cavity size was normal. Wall thickness was normal. Systolic function was normal.  ------------------------------------------------------------------- Pulmonic valve:  Structurally normal valve.  Cusp separation was normal. Doppler: Transvalvular velocity was within the normal range. There was no regurgitation.  ------------------------------------------------------------------- Tricuspid valve:  Normal thickness leaflets. Doppler: There was trivial regurgitation.  ------------------------------------------------------------------- Right atrium: The atrium was normal in size.  ------------------------------------------------------------------- Pericardium: There was no pericardial  effusion.  ------------------------------------------------------------------- Systemic veins: Inferior vena cava: The vessel was normal in size. The respirophasic diameter changes were in the normal range (>= 50%), consistent with normal central venous pressure.  ------------------------------------------------------------------- Post procedure conclusions Ascending Aorta:  - The aorta was normal, not dilated, and non-diseased.  ------------------------------------------------------------------- Measurements  Left ventricle             Value    Reference LV ID, ED, PLAX chordal    (L)   41  mm   43 - 52 LV ID, ES, PLAX chordal        29  mm   23 - 38 LV fx shortening, PLAX chordal     29  %   >=29 LV PW thickness, ED          7   mm   --------- IVS/LV PW ratio, ED          1.14     <=1.3 LV e&', lateral             16  cm/s  --------- LV E/e&', lateral            5.64     --------- LV s&', lateral             7.8  cm/s  --------- LV e&', medial             13.8 cm/s  --------- LV E/e&', medial            6.54     --------- LV e&', average             14.9 cm/s  --------- LV E/e&', average            6.05     ---------  Ventricular septum  Value    Reference IVS thickness, ED           8   mm   ---------  LVOT                  Value    Reference LVOT ID, S               19  mm   --------- LVOT area               2.84 cm^2  ---------  Aorta                 Value    Reference Aortic root ID, ED           30  mm   ---------  Left atrium              Value    Reference LA ID, A-P, ES             26  mm    --------- LA ID/bsa, A-P             1.46 cm/m^2 <=2.2 LA volume, S              60.2 ml   --------- LA volume/bsa, S            33.8 ml/m^2 --------- LA volume, ES, 1-p A4C         54.1 ml   --------- LA volume/bsa, ES, 1-p A4C       30.4 ml/m^2 --------- LA volume, ES, 1-p A2C         58.7 ml   --------- LA volume/bsa, ES, 1-p A2C       33  ml/m^2 ---------  Mitral valve              Value    Reference Mitral E-wave peak velocity      90.2 cm/s  --------- Mitral A-wave peak velocity      92.6 cm/s  --------- Mitral deceleration time        225  ms   150 - 230 Mitral peak gradient, D        3   mm Hg --------- Mitral E/A ratio, peak         1      ---------  Systemic veins             Value    Reference Estimated CVP             3   mm Hg ---------  Right ventricle            Value    Reference TAPSE                 45.3 mm   --------- RV s&', lateral, S           14.6 cm/s  ---------  Legend: (L) and (H) mark values outside specified reference range.  ------------------------------------------------------------------- Prepared and Electronically Authenticated by  Cassell Clement 2016-06-23T13:44:50  ADMISSION DATA: H&P:  Ms. Reininger is a 49 yo F with no significant past medical history who present after experiencing chest pain and a syncopal episode earlier today at work. The chest pain started at around 10 am while she was sitting during her break. The pains started in the epigastrium and then became substernal. She describes it as "extreme chest tightness" and pressure that was a 10/10, radiated to her neck, and resolved spontaneously after  10 minutes. She stood up and tried to walk, but felt hot and weak, and lost consciousness.  She reports that a coworker caught her on the way down. She denies hitting her head. The episode only lasted for a couple of seconds. No seizure activity was noted. She denies any n/v, palpitations, or diaphoresis during the episode. She recovered immediately without any neurological sequelae. She had a similar syncopal episode 2 weeks ago, without associated chest pain. She had an unremarkable workup in the ED at [facility] at that time. 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   She reports that she has had intermittent, sporadic, unprovoked episodes of chest pain for the past year. She also noticed that she has been getting lightheaded when she worked out on the treadmill over the same time period. She has also been fatigued. She denies a family history of sudden cardiac death, she has no personal history of heart disease or seizure disorder. She reports a past history of vertigo treated with meclizine, but says that these two episodes are different from her past vertigo symptoms. She reports a history of one panic attack in the 90s. She denies fevers, chills, nausea, vomiting, SOB, hemoptysis, LLE pain, weakness or numbness.   In the ED, she received a 1L NS bolus. EKG, CXR on admission were unremarkable. Initial troponin was negative. CBC, BMP, UA were wnl. She was admitted for further evaluation.  Physical Exam: Blood pressure 110/66, pulse 83, temperature 98.4 F (36.9 C), temperature source Oral, resp. rate 16, height 5\' 6"  (1.676 m), weight 66.724 kg (147 lb 1.6 oz), last menstrual period 12/30/2011, SpO2 100 %. BP 110/66 mmHg  Pulse 83  Temp(Src) 98.4 F (36.9 C) (Oral)  Resp 16  Ht 5\' 6"  (1.676 m)  Wt 66.724 kg (147 lb 1.6 oz)  BMI 23.75 kg/m2  SpO2 100%  LMP 12/30/2011  General Appearance:   Alert, cooperative, no distress  Head:   Normocephalic, without obvious abnormality, atraumatic  Eyes:   PERRL,  conjunctiva/corneas clear,   Nose:  Nares normal, septum midline, mucosa normal, no drainage  or sinus tenderness  Throat:  Lips, mucosa, and tongue normal; teeth and gums normal  Neck:  Supple, symmetrical, trachea midline, no adenopathy;   thyroid: no enlargement/tenderness/nodules; no carotid  bruit or JVD  Back:   Symmetric, no curvature, ROM normal, no CVA tenderness  Lungs:   Clear to auscultation bilaterally, respirations unlabored  Chest Wall:   No tenderness or deformity  Heart:   Regular rate and rhythm, S1 and S2 normal, 2/6 blowing holosystolic murmur, no rubs or gallops  Abdomen:   Soft, non-tender, bowel sounds active all four quadrants,   no masses, no organomegaly  Extremities:  Extremities normal, atraumatic, no cyanosis or edema  Pulses:  2+ and symmetric all extremities  Skin:  Skin color, texture, turgor normal, no rashes or lesions  Lymph nodes:  Cervical, supraclavicular nodes normal  Neurologic:  5/5 strength, sensation throughout, no focal abnormalities    Lab results: Basic Metabolic Panel:  Recent Labs (last 2 labs)      Recent Labs  10/20/14 1130  NA 139  K 4.1  CL 107  CO2 25  GLUCOSE 104*  BUN 8  CREATININE 0.77  CALCIUM 9.4     CBC:  Recent Labs (last 2 labs)      Recent Labs  10/20/14 1130  WBC 4.1  NEUTROABS 1.8  HGB  11.9*  HCT 35.7*  MCV 90.4  PLT 206     Urine Drug Screen: Drugs of Abuse   Labs (Brief)       Component Value Date/Time   LABOPIA NONE DETECTED 10/20/2014 1208   COCAINSCRNUR NONE DETECTED 10/20/2014 1208   LABBENZ NONE DETECTED 10/20/2014 1208   AMPHETMU NONE DETECTED 10/20/2014 1208   THCU NONE DETECTED 10/20/2014 1208   LABBARB NONE DETECTED 10/20/2014 1208      Urinalysis:  Recent Labs (last 2 labs)      Recent Labs  10/20/14 1208  COLORURINE YELLOW  LABSPEC 1.024  PHURINE 5.0   GLUCOSEU NEGATIVE  HGBUR NEGATIVE  BILIRUBINUR NEGATIVE  KETONESUR NEGATIVE  PROTEINUR NEGATIVE  UROBILINOGEN 0.2  NITRITE NEGATIVE  LEUKOCYTESUR TRACE*           HOSPITAL COURSE: Chest Pain /Syncope Patient presents after a short episode of syncope, preceded by an episode of atypical pressure like chest pain. She had a similar syncopal episode two weeks ago, with unremarkable EKG, CBC, BMP in the ED at [facility]. The patient recovered spontaneously without any neurological sequelae. On presentation, her vitals were stable. Cardiac exam was unremarkable, with regular rate and rhythm. EKG in the ED was unremarkable, unchanged from prior. A repeat EKG was normal. CXR without evidence of any acute cardiopulmonary process. Troponins were negative x 3. CBC, CMP, UA were all wnl. TSH, lipid panel were wnl. She had negative orthostatics. Overnight telemetry did not reveal any cardiac events. She underwent TTE, which did not reveal any valvular pathology, with normal EF 55-60%, normal systolic function, and mild (grade I) diastolic dysfunction. So far, we have not been able to explain the etiology of her syncope, but could possibly be vasovagal vs orthostatic vs paroxysmal arrhytmia. She has no significant risk factor for CAD. She will follow up with cardiology as an outpatient for possible evaluation with event monitor and/or treadmill stress test if symptoms persist.   DISCHARGE DATA: Vital Signs: BP 109/66 mmHg  Pulse 71  Temp(Src) 98.3 F (36.8 C) (Oral)  Resp 15  Ht 5' 6.75" (1.695 m)  Wt 66.588 kg (146 lb 12.8 oz)  BMI 23.18 kg/m2  SpO2 100%  LMP 12/30/2011  PE General: lying in bed, in no acute distress, anxious appearing CV: regular rate and rhythm, mild 2/6 systolic murmur unchanged from prior, No rubs, gallops Lungs: normal respiratory effort, CTAB Abdomen: + BS, non tender, non distended  Extremities: warm, well perfused, no erythema, DP + 2  bl  Labs: Results for orders placed or performed during the hospital encounter of 10/20/14 (from the past 24 hour(s))  Lipid panel     Status: None   Collection Time: 10/20/14  6:42 PM  Result Value Ref Range   Cholesterol 144 0 - 200 mg/dL   Triglycerides 28 <161 mg/dL   HDL 68 >09 mg/dL   Total CHOL/HDL Ratio 2.1 RATIO   VLDL 6 0 - 40 mg/dL   LDL Cholesterol 70 0 - 99 mg/dL  Troponin I (q 6hr x 3)     Status: None   Collection Time: 10/20/14  6:42 PM  Result Value Ref Range   Troponin I <0.03 <0.031 ng/mL  Troponin I (q 6hr x 3)     Status: None   Collection Time: 10/20/14 11:45 PM  Result Value Ref Range   Troponin I <0.03 <0.031 ng/mL  Troponin I (q 6hr x 3)     Status: None   Collection Time: 10/21/14  5:34 AM  Result Value Ref Range   Troponin I <0.03 <0.031 ng/mL  HIV antibody     Status: None   Collection Time: 10/21/14  5:34 AM  Result Value Ref Range   HIV Screen 4th Generation wRfx Non Reactive Non Reactive  TSH     Status: None   Collection Time: 10/21/14 11:37 AM  Result Value Ref Range   TSH 1.393 0.350 - 4.500 uIU/mL    Signed: Brees Hounshell, Med Student   10/21/2014, 3:52 PM

## 2014-10-21 NOTE — Progress Notes (Signed)
Subjective: NAEON. Patient denies any more episodes of chest pain or syncope. She reports burning in her feet, which is exacerbated by cold, but this seems to be a chronic issue for her.   Objective: Vital signs in last 24 hours: Filed Vitals:   10/21/14 0020 10/21/14 0400 10/21/14 0455 10/21/14 0800  BP: 109/61  115/82 112/64  Pulse: 72     Temp: 98.3 F (36.8 C)  98.4 F (36.9 C) 98.2 F (36.8 C)  TempSrc: Oral  Oral Oral  Resp:   16 16  Height:  5' 6.75" (1.695 m)    Weight:  66.588 kg (146 lb 12.8 oz)    SpO2: 100%  100% 100%   General: lying in bed, in no acute distress, anxious appearing CV: regular rate and rhythm, mild 2/6 systolic murmur unchanged from prior,  No rubs, gallops Lungs: normal respiratory effort, CTAB Abdomen: + BS, non tender, non distended  Extremities: warm, well perfused, no erythema, DP + 2 bl  Lab Results: Basic Metabolic Panel:  Recent Labs  20/35/59 1842 10/20/14 2345 10/21/14 0534  TROPONINI <0.03 <0.03 <0.03   Lipid Panel     Component Value Date/Time   CHOL 144 10/20/2014 1842   TRIG 28 10/20/2014 1842   HDL 68 10/20/2014 1842   CHOLHDL 2.1 10/20/2014 1842   VLDL 6 10/20/2014 1842   LDLCALC 70 10/20/2014 1842    EKG: Normal sinus rhythm early repolarization No significant change since last tracing 10/20/14 Confirmed by [facility] MD, Fayrene Fearing ([id number]) on 10/21/2014 9:22:58 AM  Medications: I have reviewed the patient's current medications. Scheduled Meds: . B-complex with vitamin C  1 tablet Oral Daily  . enoxaparin (LOVENOX) injection  40 mg Subcutaneous Q24H   Continuous Infusions:  PRN Meds:.acetaminophen, gi cocktail Assessment/Plan: Principal Problem:   Chest pain, atypical Active Problems:   GERD   SHOULDER PAIN, LEFT   NECK PAIN, CHRONIC  This is a 49 yo F with no PMH who presents after experiencing a syncopal episode and chest pain.   Chest pain with syncope Patient presents after a short episode of syncope,  preceded by an episode of atypical pressure like chest pain. She had a similar syncopal episode two weeks ago, with unremarkable EKG, CBC, BMP. The patient recovered spontaneously without any neurological sequelae. On exam, the patient is afebrile and normotensive. Cardiac exam unremarkable, with regular rate and rhythm. EKG in the ED was unremarkable, unchanged from prior. CXR without evidence of any acute cardiopulmonary process. Initial troponin was negative. CBC, CMP, UA were all wnl. The patient recurrent episodes of syncope and chest pain are most likely cardiac in etiology, such as an arrhytmia vs valvular abnormality. Ischemia is less likely, given negative EKG, negative initial troponin, and no risk factors. Vasovagal syncope is also a possibility, given prodrome of warmth prior to loss of consciousness, but pt has no prior history of syncope, and chest pain is certainly concerning for cardiac causes.  - admit to telemetry- no events recorded - orthostatic vitals wnl - trend troponins - negative x 3 - Repeat EKG this am wnl, with no changes - lipid panel wnl - echocardiogram today, if negative will set up for outpatient follow up with cardiology for event monitor - cardiology team recommended TTE today, and outpatient follow up for even monitor and/or treadmill stress test if symptoms persist -  TSH   DVT ppx - lovenox   FENGI:  -heart healthy  - replete electrolytes as needed   Dispo: Awaiting TTE  results, will discharge with outpatient cardiology follow up if negative.   This is a Psychologist, occupational Note.  The care of the patient was discussed with Dr. Delane Ginger and the assessment and plan formulated with their assistance.  Please see their attached note for official documentation of the daily encounter.     [employer], Med Student 10/21/2014, 11:23 AM

## 2014-10-21 NOTE — Progress Notes (Signed)
Echocardiogram 2D Echocardiogram has been performed.  Jacqueline Bray 10/21/2014, 10:57 AM

## 2014-10-21 NOTE — Progress Notes (Signed)
Orthostatic BPs: Lying-115/67 HR74     Sitting-119/71 HR79   Standing- 115/82 HR80. Pt tolerated well with no weakness or syncopal episodes

## 2014-10-21 NOTE — Discharge Summary (Signed)
Name: Jacqueline Bray MRN: [id number] DOB: [birth year] 49 y.o. PCP: Loyal Jacobson, MD ______________________________________________________________  Date of Admission: 10/20/2014 11:12 AM Date of Discharge: 10/21/2014 Attending Physician: Levert Feinstein, MD   Discharge Diagnosis: Atypical chest pain Syncope Chronic left arm, neck, and shoulder pain    Discharge Medications:   Medication List    STOP taking these medications        predniSONE 10 MG tablet  Commonly known as:  DELTASONE     THERAFLU COLD & COUGH PO      TAKE these medications        acetaminophen 500 MG tablet  Commonly known as:  TYLENOL  Take 1,000 mg by mouth every 6 (six) hours as needed for moderate pain or headache.     B COMPLEX PO  Take 1 tablet by mouth daily.        Disposition and follow-up:   Jacqueline Bray was discharged from [facility] in good condition to home.  Please address the following problems post-discharge:  1.Further syncopal episodes-->follow up with cardiology for event monitor per Dr. Patty Sermons  2.Further chest pain 3.Address anxiety?    Labs / imaging needed at time of follow-up: None  Pending labs/ test needing follow-up: None  Follow-up Appointments: Follow-up Information    Follow up with Sid Falcon, MD.   Specialty:  Family Medicine   Why:  A hospital follow up appointment   Contact information:   [address] [place] [address] [phone]       Call Ronny Flurry, MD.   Specialty:  Cardiology   Why:  A cardiology appointment    Contact information:   [address] [place] [address] [phone]       Discharge Instructions: Discharge Instructions    Diet - low sodium heart healthy    Complete by:  As directed      Discharge instructions    Complete by:  As directed   Please follow up with your primary physician and cardiology.     Increase activity slowly     Complete by:  As directed            Consultations: Treatment Team:  Rounding Lbcardiology, MD Cassell Clement, MD  Procedures Performed:  Dg Chest 2 View  10/20/2014   CLINICAL DATA:  Initial encounter for chest pain  EXAM: CHEST  2 VIEW  COMPARISON:  03/30/2012.  FINDINGS: The heart size and mediastinal contours are within normal limits. Both lungs are clear. The visualized skeletal structures are unremarkable.  IMPRESSION: No active cardiopulmonary disease.   Electronically Signed   By: Kennith Center M.D.   On: 10/20/2014 12:06    2D Echo:  Date: 10/21/2014 Study Conclusions - Left ventricle: The cavity size was normal. Wall thickness was normal. Systolic function was normal. The estimated ejection fraction was in the range of 55% to 60%. Wall motion was normal; there were no regional wall motion abnormalities. Doppler parameters are consistent with abnormal left ventricular relaxation (grade 1 diastolic dysfunction).   Cardiac Cath: N/A  Admission HPI:  Jacqueline Bray is a 49 y.o. female who has a past medical history of Arthritis; Fibromyalgia (dx'd 1991); and Lightheadedness.Marland Kitchen Pt presents to the ED with chest pain and syncope.   Pt states that while she was at work this AM she was sitting in the chair and began having epigastric chest pain that radiated up into her chest.  She describes the pain as "discomfort" and "tightness". States it lasted about 5 minutes and subsided within 5 minutes when she stood up. She states it happened to her previously once before. Also endorses diaphoresis and dizziness. Denies exertional CP, nausea, or vomiting. Denies any h/o GERD. Denies h/o seizures. No tongue biting or loss of bowel or bladder control. LMP was 2 years ago. Also without changes in appetite and has been eating and drinking normally. 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 Denies cigarette use or other  recreational drug use. Drinks a mixed drink one and a while.   Of note, she is requesting to have a stress test. In the ED, she was given a 1L NS bolus. EKG was without acute changes and i-stat and trop x 1 neg. UDS neg.   Hospital Course by problem list: Principal Problem:   Chest pain, atypical Active Problems:   GERD   SHOULDER PAIN, LEFT   NECK PAIN, CHRONIC   Atypical chest pain  Patient presents after a syncopal episode,  preceded by an episode of atypical pressure like chest pain. She had a similar syncopal episode two weeks ago, with unremarkable EKG, CBC, BMP in the ED at [facility]. The patient recovered spontaneously without any neurological sequelae. On presentation, her vitals were stable. Cardiac exam was unremarkable, with regular rate and rhythm. EKG in the ED was unremarkable, unchanged from prior. A repeat EKG was normal. CXR without evidence of any acute cardiopulmonary process. Troponins were negative x 3. CBC, CMP, UA were all wnl. TSH, lipid panel were wnl. She had negative orthostatics. Overnight telemetry did not reveal any cardiac events. She underwent TTE, which did not reveal any valvular pathology, with normal EF 55-60%, normal systolic function, and mild (grade I) diastolic dysfunction. So far, we have not been able to explain the etiology of her syncope, but could possibly be vasovagal vs paroxysmal arhythmia.  She has no significant risk factors for CAD. She will follow up with cardiology as an outpatient for evaluation with event monitor and/or treadmill stress test if symptoms persist.   Syncope Per above.   Chronic left shoulder, arm pain/neck pain She's experienced left arm and neck pain for a while now and seems frustrated that her PCP has not been able to find a cause for her symptoms. 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 No red flags.  Will defer to PCP.     Discharge Vitals:   BP 109/66 mmHg  Pulse 71  Temp(Src) 98.3 F (36.8 C) (Oral)  Resp 15  Ht 5' 6.75" (1.695 m)  Wt 146 lb 12.8 oz (66.588 kg)  BMI 23.18 kg/m2  SpO2 100%  LMP 12/30/2011  Discharge Labs:  Results for orders placed or performed during the hospital encounter of 10/20/14 (from the past 24 hour(s))  Lipid panel     Status: None   Collection Time: 10/20/14  6:42 PM  Result Value Ref Range   Cholesterol 144 0 - 200 mg/dL   Triglycerides 28 <370 mg/dL   HDL 68 >48 mg/dL   Total CHOL/HDL Ratio 2.1 RATIO   VLDL 6 0 - 40 mg/dL   LDL Cholesterol 70 0 - 99 mg/dL  Troponin I (q 6hr x 3)     Status: None   Collection Time: 10/20/14  6:42 PM  Result Value Ref Range  Troponin I <0.03 <0.031 ng/mL  Troponin I (q 6hr x 3)     Status: None   Collection Time: 10/20/14 11:45 PM  Result Value Ref Range   Troponin I <0.03 <0.031 ng/mL  Troponin I (q 6hr x 3)     Status: None   Collection Time: 10/21/14  5:34 AM  Result Value Ref Range   Troponin I <0.03 <0.031 ng/mL  TSH     Status: None   Collection Time: 10/21/14 11:37 AM  Result Value Ref Range   TSH 1.393 0.350 - 4.500 uIU/mL    Signed: Marrian Salvage, MD 10/21/2014, 2:40 PM   Services Ordered on Discharge: None Equipment Ordered on Discharge: None

## 2014-10-21 NOTE — Discharge Instructions (Signed)
Please keep your follow-up appointments; this is very important for your continued recovery.    We have made the following additions/changes to your medications:  Please refer to your medication list.    Please continue to take all of your medications as prescribed.  Do not miss any doses without contacting your primary physician.  If you have questions, please contact your physician or contact the Internal Medicine Teaching Service at [phone].  Please bring your medicications with you to your appointments; medications may be eye drops, herbals, vitamins, or pills.    If you believe you are suffering from a life-threatening emergency, go to the nearest Emergency Department.

## 2014-10-21 NOTE — Consult Note (Signed)
CARDIOLOGY CONSULT NOTE   Patient ID: TRINITEY RAPOZO MRN: [id number], DOB/AGE: 1966-03-30   Admit date: 10/20/2014 Date of Consult: 10/21/2014   Primary Physician: Sid Falcon, MD Primary Cardiologist: None  Pt. Profile  49 year old woman without prior history of known heart problems was admitted yesterday after having syncope at work.  There was associated chest discomfort.  Problem List  Past Medical History  Diagnosis Date  . Arthritis     "joint stiffness" (10/20/2014)  . Fibromyalgia dx'd 1991  . Lightheadedness     "chronic; like I'm walking around in a fog; activity makes it worse" (10/20/2014)    Past Surgical History  Procedure Laterality Date  . Hammer toe surgery Bilateral 1990's  . Dilation and curettage of uterus  ~ 2004    S/P miscarriage     Allergies  Allergies  Allergen Reactions  . Hydrocodone Other (See Comments)    vertigo    HPI   This 49 year old woman is admitted after experiencing lightheadedness and subsequent syncope at work.  She was on her 10 minute break.  At the end of the break as she was sitting down she felt a slight tightness in her chest.  She also felt lightheaded.  She was talking with coworkers.  After standing up her chest discomfort intensified and she became more lightheaded.  She tried to reach for a nearby desk but awoke finding herself on the floor surrounded by coworkers.  She has a prior history of lightheadedness.  She had a similar episode 2 weeks ago which was not associated with any chest discomfort and did not result in syncope.  She does not take any cardiac medications at home.  She denies any history of hypertension.  She has a history of fibromyalgia.  She recently completed a course of steroids. She denies any knowledge of prior heart murmur.  Inpatient Medications  . B-complex with vitamin C  1 tablet Oral Daily  . enoxaparin (LOVENOX) injection  40 mg Subcutaneous Q24H    Family History History  reviewed. No pertinent family history. denies any history of premature coronary disease in the family.  No known history of cardiomyopathy  Social History History   Social History  . Marital Status: Single    Spouse Name: N/A  . Number of Children: N/A  . Years of Education: N/A   Occupational History  . Not on file.   Social History Main Topics  . Smoking status: Never Smoker   . Smokeless tobacco: Never Used  . Alcohol Use: Yes     Comment: 10/20/2014 "might have 2 drinks/month"  . Drug Use: No  . Sexual Activity: Not Currently    Birth Control/ Protection: None   Other Topics Concern  . Not on file   Social History Narrative     Review of Systems  General:  No chills, fever, night sweats or weight changes.  Cardiovascular:  No chest pain, dyspnea on exertion, edema, orthopnea, palpitations, paroxysmal nocturnal dyspnea. Dermatological: No rash, lesions/masses Respiratory: No cough, dyspnea Urologic: No hematuria, dysuria Abdominal:   No nausea, vomiting, diarrhea, bright red blood per rectum, melena, or hematemesis Neurologic:  No visual changes, wkns, changes in mental status. All other systems reviewed and are otherwise negative except as noted above.  Physical Exam  Blood pressure 115/82, pulse 72, temperature 98.4 F (36.9 C), temperature source Oral, resp. rate 16, height 5' 6.75" (1.695 m), weight 146 lb 12.8 oz (66.588 kg), last menstrual period 12/30/2011, SpO2 100 %.  General: Pleasant, NAD Psych: Normal affect. Neuro: Alert and oriented X 3. Moves all extremities spontaneously. HEENT: Normal  Neck: Supple without bruits or JVD. Lungs:  Resp regular and unlabored, CTA. Heart: RRR no s3, s4, there is a grade 2/6 systolic ejection murmur along the  left sternal edge.  No diastolic murmur.  No rub  Abdomen: Soft, non-tender, non-distended, BS + x 4.  Extremities: No clubbing, cyanosis or edema. DP/PT/Radials 2+ and equal bilaterally.  Labs   Recent  Labs  10/20/14 1842 10/20/14 2345 10/21/14 0534  TROPONINI <0.03 <0.03 <0.03   Lab Results  Component Value Date   WBC 4.1 10/20/2014   HGB 11.9* 10/20/2014   HCT 35.7* 10/20/2014   MCV 90.4 10/20/2014   PLT 206 10/20/2014     Recent Labs Lab 10/20/14 1130  NA 139  K 4.1  CL 107  CO2 25  BUN 8  CREATININE 0.77  CALCIUM 9.4  GLUCOSE 104*   Lab Results  Component Value Date   CHOL 144 10/20/2014   HDL 68 10/20/2014   LDLCALC 70 10/20/2014   TRIG 28 10/20/2014   No results found for: DDIMER  Radiology/Studies  Dg Chest 2 View  10/20/2014   CLINICAL DATA:  Initial encounter for chest pain  EXAM: CHEST  2 VIEW  COMPARISON:  03/30/2012.  FINDINGS: The heart size and mediastinal contours are within normal limits. Both lungs are clear. The visualized skeletal structures are unremarkable.  IMPRESSION: No active cardiopulmonary disease.   Electronically Signed   By: Kennith Center M.D.   On: 10/20/2014 12:06    ECG  Normal sinus rhythm.  Benign early repolarization.  No ischemic changes.  Personally reviewed.  ASSESSMENT AND PLAN  1.  Syncope, uncertain etiology, possibly vasovagal. 2.  Atypical chest pain.  No significant risk factors for premature coronary disease.  Troponins are normal 3 this admission.  Cholesterol is normal. 3.  Systolic heart murmur  Recommendation: Await results of echocardiogram.  If echocardiogram is normal, anticipate she will be able to be discharged later today.  Can consider outpatient treadmill stress test if symptoms persist.  Telemetry has been normal so far this admission.  Can consider outpatient event monitor to evaluate further.   Karie Schwalbe MD  10/21/2014, 8:56 AM

## 2014-10-21 NOTE — Progress Notes (Signed)
Subjective:   VSS.  No overnight events.  Denies any other episodes of chest pain.  She does have a lot of vague complaints including chronic neck and left arm pain.  Also c/o "burning" in her feet?  She appears very anxious.   No further syncopal episodes.  Orthostatics were neg.  She was given IVF overnight.   Objective:   Vital signs in last 24 hours: Filed Vitals:   10/20/14 2042 10/21/14 0020 10/21/14 0400 10/21/14 0455  BP: 108/51 109/61  115/82  Pulse: 85 72    Temp: 97.7 F (36.5 C) 98.3 F (36.8 C)  98.4 F (36.9 C)  TempSrc: Oral Oral  Oral  Resp:    16  Height:   5' 6.75" (1.695 m)   Weight:   146 lb 12.8 oz (66.588 kg)   SpO2: 99% 100%  100%    Weight: Filed Weights   10/20/14 1117 10/20/14 1746 10/21/14 0400  Weight: 145 lb (65.772 kg) 147 lb 1.6 oz (66.724 kg) 146 lb 12.8 oz (66.588 kg)    I/Os: No intake or output data in the 24 hours ending 10/21/14 1051  Physical Exam: Constitutional: Vital signs reviewed.  Patient is lying in bed in no acute distress and cooperative with exam.   HEENT: [place]/AT; EOMI, conjunctivae normal, no scleral icterus  Cardiovascular: RRR, systolic murmur Pulmonary/Chest: normal respiratory effort, no accessory muscle use, CTAB, no wheezes, rales, or rhonchi Abdominal: Soft. +BS, NT/ND Neurological: A&O x3, CN II-XII grossly intact; non-focal exam Extremities: 2+DP b/l,  no LE edema Skin: Warm, dry and intact. No rash Psych: Anxious, flat affect  Lab Results:  BMP:  Recent Labs Lab 10/20/14 1130  NA 139  K 4.1  CL 107  CO2 25  GLUCOSE 104*  BUN 8  CREATININE 0.77  CALCIUM 9.4    CBC:  Recent Labs Lab 10/20/14 1130  WBC 4.1  NEUTROABS 1.8  HGB 11.9*  HCT 35.7*  MCV 90.4  PLT 206    Coagulation: No results for input(s): LABPROT, INR in the last 168 hours.  CBG:           No results for input(s): GLUCAP in the last 168 hours.         HA1C:      No results for input(s): HGBA1C in the last 168  hours.  Lipid Panel:  Recent Labs Lab 10/20/14 1842  CHOL 144  HDL 68  LDLCALC 70  TRIG 28  CHOLHDL 2.1    LFTs: No results for input(s): AST, ALT, ALKPHOS, BILITOT, PROT, ALBUMIN in the last 168 hours.  Pancreatic Enzymes: No results for input(s): LIPASE, AMYLASE in the last 168 hours.  Lactic Acid/Procalcitonin: No results for input(s): LATICACIDVEN, PROCALCITON in the last 168 hours.  Ammonia: No results for input(s): AMMONIA in the last 168 hours.  Cardiac Enzymes:  Recent Labs Lab 10/20/14 1842 10/20/14 2345 10/21/14 0534  TROPONINI <0.03 <0.03 <0.03    EKG: EKG Interpretation  Date/Time:  Wednesday October 20 2014 11:16:57 EDT Ventricular Rate:  87 PR Interval:  146 QRS Duration: 70 QT Interval:  402 QTC Calculation: 484 R Axis:   83 Text Interpretation:  Sinus rhythm Biatrial enlargement RSR' in V1 or V2, probably normal variant ST elev, probable normal early repol pattern Baseline wander in lead(s) II III aVR aVL aVF since last tracing no significant change Confirmed by MILLER  MD, BRIAN ([id number]) on 10/20/2014 12:25:22 PM   BNP: No results for input(s): PROBNP in  the last 168 hours.  D-Dimer: No results for input(s): DDIMER in the last 168 hours.  Urinalysis:  Recent Labs Lab 10/20/14 1208  COLORURINE YELLOW  LABSPEC 1.024  PHURINE 5.0  GLUCOSEU NEGATIVE  HGBUR NEGATIVE  BILIRUBINUR NEGATIVE  KETONESUR NEGATIVE  PROTEINUR NEGATIVE  UROBILINOGEN 0.2  NITRITE NEGATIVE  LEUKOCYTESUR TRACE*    Micro Results: No results found for this or any previous visit (from the past 240 hour(s)).  Blood Culture: No results found for: SDES, SPECREQUEST, CULT, REPTSTATUS  Studies/Results: Dg Chest 2 View  10/20/2014   CLINICAL DATA:  Initial encounter for chest pain  EXAM: CHEST  2 VIEW  COMPARISON:  03/30/2012.  FINDINGS: The heart size and mediastinal contours are within normal limits. Both lungs are clear. The visualized skeletal structures are  unremarkable.  IMPRESSION: No active cardiopulmonary disease.   Electronically Signed   By: Kennith Center M.D.   On: 10/20/2014 12:06    Medications:  Scheduled Meds: . B-complex with vitamin C  1 tablet Oral Daily  . enoxaparin (LOVENOX) injection  40 mg Subcutaneous Q24H   Continuous Infusions:  PRN Meds: acetaminophen, gi cocktail  Antibiotics: Antibiotics Given (last 72 hours)    None      Day of Hospitalization:   Consults: Treatment Team:  Rounding Lbcardiology, MD Cassell Clement, MD  Assessment/Plan:   Principal Problem:   Chest pain, atypical Active Problems:   GERD   SHOULDER PAIN, LEFT   NECK PAIN, CHRONIC  Atypical Chest Pain Resolved.  Pt has no risk factors for ACS. Chest pain always occurs while sitting and is never exertional.  Lipid panel wnl.  TIMI score: 0. Pt does appear to be very anxious which may also be contributory. CXR wnl. Trop x 3 neg. EKG without acute changes.  Cardiology consulted for further recs.   TTE pending.  She may be able to go home later today with possible event monitor and/or exercise stress set up as an outpatient.   -appreciate cards recs -TTE pending  -if TTE neg, will likely set up event monitor and cards f/u   Chronic left shoulder, arm pain/neck pain She's experienced left arm and neck pain for a while now and seems frustrated that her PCP has not been able to find a cause for her symptoms.  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  No red flags. -defer to PCP   F/E/N Fluids- None  Electrolytes- Replete as needed  Nutrition- HH/Carb mod diet   VTE PPx  lovenox  SQ qd  Disposition Discharge later today if TTE OK with cards follow up.    Marrian Salvage, MD PGY-2, Internal Medicine Teaching Service 10/21/2014, 10:51 AM

## 2014-11-25 ENCOUNTER — Encounter: Payer: Self-pay | Admitting: *Deleted

## 2014-12-01 ENCOUNTER — Encounter: Payer: 59 | Admitting: Physician Assistant

## 2014-12-31 ENCOUNTER — Encounter: Payer: 59 | Admitting: Physician Assistant

## 2017-04-01 ENCOUNTER — Encounter: Payer: Self-pay | Admitting: Obstetrics and Gynecology

## 2017-04-01 ENCOUNTER — Ambulatory Visit (INDEPENDENT_AMBULATORY_CARE_PROVIDER_SITE_OTHER): Payer: 59 | Admitting: Obstetrics and Gynecology

## 2017-04-01 VITALS — BP 120/89 | HR 85 | Ht 66.0 in | Wt 148.0 lb

## 2017-04-01 DIAGNOSIS — Z1151 Encounter for screening for human papillomavirus (HPV): Secondary | ICD-10-CM

## 2017-04-01 DIAGNOSIS — Z124 Encounter for screening for malignant neoplasm of cervix: Secondary | ICD-10-CM

## 2017-04-01 DIAGNOSIS — Z01419 Encounter for gynecological examination (general) (routine) without abnormal findings: Secondary | ICD-10-CM | POA: Diagnosis not present

## 2017-04-01 DIAGNOSIS — Z113 Encounter for screening for infections with a predominantly sexual mode of transmission: Secondary | ICD-10-CM | POA: Diagnosis not present

## 2017-04-01 LAB — POCT URINALYSIS DIPSTICK
BILIRUBIN UA: NEGATIVE
Blood, UA: NEGATIVE
Glucose, UA: NEGATIVE
KETONES UA: NEGATIVE
Nitrite, UA: NEGATIVE
Protein, UA: NEGATIVE
SPEC GRAV UA: 1.015 (ref 1.010–1.025)
Urobilinogen, UA: 0.2 E.U./dL
pH, UA: 7 (ref 5.0–8.0)

## 2017-04-01 NOTE — Progress Notes (Signed)
Subjective:     Jacqueline Bray is a 51 y.o. female with BMI 23 who is here for a comprehensive physical exam. The patient reports no problems. She has been postmenopausal for the past 2 years. She denies any vaginal bleeding. She is sexually active without complaints. She denies any pelvic pain or abnormal discharge. She denies any urinary incontinence. She reports urinary frequency over the past few weeks.   Past Medical History:  Diagnosis Date  . Arthritis    "joint stiffness" (10/20/2014)  . Fibromyalgia dx'd 1991  . Lightheadedness    "chronic; like I'm walking around in a fog; activity makes it worse" (10/20/2014)   Past Surgical History:  Procedure Laterality Date  . DILATION AND CURETTAGE OF UTERUS  ~ 2004   S/P miscarriage  . HAMMER TOE SURGERY Bilateral 1990's   Family History  Problem Relation Age of Onset  . Cancer Mother        LUNG  . Heart attack Mother   . Anemia Daughter     Social History   Socioeconomic History  . Marital status: Single    Spouse name: Not on file  . Number of children: Not on file  . Years of education: Not on file  . Highest education level: Not on file  Social Needs  . Financial resource strain: Not on file  . Food insecurity - worry: Not on file  . Food insecurity - inability: Not on file  . Transportation needs - medical: Not on file  . Transportation needs - non-medical: Not on file  Occupational History  . Not on file  Tobacco Use  . Smoking status: Never Smoker  . Smokeless tobacco: Never Used  Substance and Sexual Activity  . Alcohol use: Yes    Comment: 10/20/2014 "might have 2 drinks/month"  . Drug use: No  . Sexual activity: Yes    Birth control/protection: None  Other Topics Concern  . Not on file  Social History Narrative  . Not on file   Health Maintenance  Topic Date Due  . Hepatitis C Screening  [birth year]  . MAMMOGRAM  08/30/2013  . COLONOSCOPY  08/30/2013  . PAP SMEAR  11/15/2015  . INFLUENZA VACCINE   11/28/2016  . TETANUS/TDAP  01/27/2019  . HIV Screening  Completed    Review of Systems Pertinent items are noted in HPI.   Objective:  Blood pressure 120/89, pulse 85, height 5\' 6"  (1.676 m), weight 148 lb (67.1 kg), last menstrual period 12/30/2011.     GENERAL: Well-developed, well-nourished female in no acute distress.  HEENT: Normocephalic, atraumatic. Sclerae anicteric.  NECK: Supple. Normal thyroid.  LUNGS: Clear to auscultation bilaterally.  HEART: Regular rate and rhythm. BREASTS: Symmetric in size. No palpable masses or lymphadenopathy, skin changes, or nipple drainage. ABDOMEN: Soft, nontender, nondistended. No organomegaly. PELVIC: Normal external female genitalia. Vagina is pink and rugated.  Normal discharge. Normal appearing cervix. Uterus is normal in size. No adnexal mass or tenderness. EXTREMITIES: No cyanosis, clubbing, or edema, 2+ distal pulses.    Assessment:    Healthy female exam.      Plan:    Pap smear collected Patient requested STI screening Screening mammogram ordered Patient advised to perform monthly self breast and vulva exam Patient will be contacted with any abnormal results RTC in 1 year or prn See After Visit Summary for Counseling Recommendations

## 2017-04-01 NOTE — Progress Notes (Signed)
Pt presents for annual, pap, and all std testing. Pt c/o brown discharge no, denies itching. Pt c/o urinary frequency. MGM due. Never had colonoscopy.

## 2017-04-02 LAB — CBC
HEMATOCRIT: 36.9 % (ref 34.0–46.6)
Hemoglobin: 12.3 g/dL (ref 11.1–15.9)
MCH: 30 pg (ref 26.6–33.0)
MCHC: 33.3 g/dL (ref 31.5–35.7)
MCV: 90 fL (ref 79–97)
Platelets: 245 10*3/uL (ref 150–379)
RBC: 4.1 x10E6/uL (ref 3.77–5.28)
RDW: 13.9 % (ref 12.3–15.4)
WBC: 5.1 10*3/uL (ref 3.4–10.8)

## 2017-04-02 LAB — CERVICOVAGINAL ANCILLARY ONLY
Bacterial vaginitis: NEGATIVE
CANDIDA VAGINITIS: NEGATIVE
Chlamydia: NEGATIVE
Neisseria Gonorrhea: NEGATIVE
Trichomonas: NEGATIVE

## 2017-04-02 LAB — COMPREHENSIVE METABOLIC PANEL
ALT: 13 IU/L (ref 0–32)
AST: 19 IU/L (ref 0–40)
Albumin/Globulin Ratio: 1.9 (ref 1.2–2.2)
Albumin: 5.1 g/dL (ref 3.5–5.5)
Alkaline Phosphatase: 100 IU/L (ref 39–117)
BUN/Creatinine Ratio: 13 (ref 9–23)
BUN: 11 mg/dL (ref 6–24)
Bilirubin Total: 0.4 mg/dL (ref 0.0–1.2)
CO2: 25 mmol/L (ref 20–29)
Calcium: 9.8 mg/dL (ref 8.7–10.2)
Chloride: 101 mmol/L (ref 96–106)
Creatinine, Ser: 0.88 mg/dL (ref 0.57–1.00)
GFR calc Af Amer: 87 mL/min/{1.73_m2} (ref >59)
GFR calc non Af Amer: 75 mL/min/{1.73_m2} (ref >59)
Globulin, Total: 2.7 g/dL (ref 1.5–4.5)
Glucose: 90 mg/dL (ref 65–99)
POTASSIUM: 4 mmol/L (ref 3.5–5.2)
Sodium: 141 mmol/L (ref 134–144)
TOTAL PROTEIN: 7.8 g/dL (ref 6.0–8.5)

## 2017-04-02 LAB — RPR: RPR Ser Ql: NONREACTIVE

## 2017-04-02 LAB — HEMOGLOBIN A1C
ESTIMATED AVERAGE GLUCOSE: 114 mg/dL
HEMOGLOBIN A1C: 5.6 % (ref 4.8–5.6)

## 2017-04-02 LAB — VITAMIN D 25 HYDROXY (VIT D DEFICIENCY, FRACTURES): VIT D 25 HYDROXY: 28.6 ng/mL — AB (ref 30.0–100.0)

## 2017-04-02 LAB — HEPATITIS C ANTIBODY

## 2017-04-02 LAB — TSH: TSH: 1.1 u[IU]/mL (ref 0.450–4.500)

## 2017-04-02 LAB — HEPATITIS B SURFACE ANTIGEN: Hepatitis B Surface Ag: NEGATIVE

## 2017-04-02 LAB — HIV ANTIBODY (ROUTINE TESTING W REFLEX): HIV Screen 4th Generation wRfx: NONREACTIVE

## 2017-04-03 LAB — CYTOLOGY - PAP
DIAGNOSIS: NEGATIVE
HPV: NOT DETECTED

## 2017-04-04 ENCOUNTER — Telehealth: Payer: Self-pay

## 2017-04-04 ENCOUNTER — Other Ambulatory Visit: Payer: Self-pay | Admitting: Obstetrics and Gynecology

## 2017-04-04 LAB — URINE CULTURE

## 2017-04-04 MED ORDER — SULFAMETHOXAZOLE-TRIMETHOPRIM 800-160 MG PO TABS: 1.0000 | ORAL_TABLET | Freq: Two times a day (BID) | ORAL | 1 refills | Status: AC

## 2017-04-04 NOTE — Telephone Encounter (Signed)
-----   Message from Catalina AntiguaPeggy Constant, MD sent at 04/04/2017 10:39 AM EST ----- Please inform patient of normal labs including pap smear.  Her vitamin D levels are a little low (nothing terrible), I would still recommend for that she takes an over the counter daily vitamin D supplement  Thanks  Kinder Morgan EnergyPeggy

## 2017-04-04 NOTE — Telephone Encounter (Signed)
Pt informed of results. Pt would like to know if she can have a rx for the brown discharge that she is having. I informed pt that her wet prep came back normal. She still would like to have something sent in preferably the cream for bv.

## 2017-04-04 NOTE — Telephone Encounter (Signed)
Left message for pt to return call.

## 2017-04-04 NOTE — Telephone Encounter (Signed)
Pt informed

## 2017-05-01 ENCOUNTER — Ambulatory Visit
Admission: RE | Admit: 2017-05-01 | Discharge: 2017-05-01 | Disposition: A | Discharge status: Home / Self Care | Payer: 59 | Source: Ambulatory Visit | Attending: Obstetrics and Gynecology | Admitting: Obstetrics and Gynecology

## 2017-05-01 DIAGNOSIS — Z01419 Encounter for gynecological examination (general) (routine) without abnormal findings: Secondary | ICD-10-CM

## 2017-05-02 ENCOUNTER — Other Ambulatory Visit: Payer: Self-pay | Admitting: Obstetrics and Gynecology

## 2017-05-02 DIAGNOSIS — R928 Other abnormal and inconclusive findings on diagnostic imaging of breast: Secondary | ICD-10-CM

## 2017-05-06 ENCOUNTER — Telehealth: Payer: Self-pay

## 2017-05-06 NOTE — Telephone Encounter (Signed)
Pt called requesting results from breast US. Pt states that she received a call from the breast center for another scan. She is concerned about her results and why another scan is needed.

## 2017-05-08 NOTE — Telephone Encounter (Signed)
Pt informed

## 2022-10-01 ENCOUNTER — Encounter (HOSPITAL_BASED_OUTPATIENT_CLINIC_OR_DEPARTMENT_OTHER): Payer: Self-pay

## 2022-10-01 ENCOUNTER — Other Ambulatory Visit: Payer: Self-pay

## 2022-10-01 ENCOUNTER — Emergency Department (HOSPITAL_BASED_OUTPATIENT_CLINIC_OR_DEPARTMENT_OTHER): Payer: BC Managed Care – PPO

## 2022-10-01 ENCOUNTER — Emergency Department (HOSPITAL_BASED_OUTPATIENT_CLINIC_OR_DEPARTMENT_OTHER)
Admission: EM | Admit: 2022-10-01 | Discharge: 2022-10-01 | Disposition: A | Discharge status: Home / Self Care | Payer: BC Managed Care – PPO | Attending: Emergency Medicine | Admitting: Emergency Medicine

## 2022-10-01 DIAGNOSIS — R109 Unspecified abdominal pain: Secondary | ICD-10-CM | POA: Diagnosis present

## 2022-10-01 LAB — URINALYSIS, ROUTINE W REFLEX MICROSCOPIC
Bilirubin Urine: NEGATIVE
Glucose, UA: NEGATIVE mg/dL
Hgb urine dipstick: NEGATIVE
Ketones, ur: NEGATIVE mg/dL
Leukocytes,Ua: NEGATIVE
Nitrite: NEGATIVE
Protein, ur: NEGATIVE mg/dL
Specific Gravity, Urine: >=1.03 (ref 1.005–1.030)
pH: 6 (ref 5.0–8.0)

## 2022-10-01 LAB — CBC
HCT: 41.7 % (ref 36.0–46.0)
Hemoglobin: 13.7 g/dL (ref 12.0–15.0)
MCH: 30.3 pg (ref 26.0–34.0)
MCHC: 32.9 g/dL (ref 30.0–36.0)
MCV: 92.3 fL (ref 80.0–100.0)
Platelets: 235 10*3/uL (ref 150–400)
RBC: 4.52 MIL/uL (ref 3.87–5.11)
RDW: 12.5 % (ref 11.5–15.5)
WBC: 4.5 10*3/uL (ref 4.0–10.5)
nRBC: 0 % (ref 0.0–0.2)

## 2022-10-01 LAB — COMPREHENSIVE METABOLIC PANEL
ALT: 16 U/L (ref 0–44)
AST: 22 U/L (ref 15–41)
Albumin: 4.7 g/dL (ref 3.5–5.0)
Alkaline Phosphatase: 92 U/L (ref 38–126)
Anion gap: 9 (ref 5–15)
BUN: 12 mg/dL (ref 6–20)
CO2: 24 mmol/L (ref 22–32)
Calcium: 9.4 mg/dL (ref 8.9–10.3)
Chloride: 102 mmol/L (ref 98–111)
Creatinine, Ser: 1.03 mg/dL — ABNORMAL HIGH (ref 0.44–1.00)
GFR, Estimated: >60 mL/min (ref >60)
Glucose, Bld: 91 mg/dL (ref 70–99)
Potassium: 4.1 mmol/L (ref 3.5–5.1)
Sodium: 135 mmol/L (ref 135–145)
Total Bilirubin: 0.4 mg/dL (ref 0.3–1.2)
Total Protein: 8.6 g/dL — ABNORMAL HIGH (ref 6.5–8.1)

## 2022-10-01 LAB — LIPASE, BLOOD: Lipase: 25 U/L (ref 11–51)

## 2022-10-01 MED ORDER — MELOXICAM 15 MG PO TABS: 15.0000 mg | ORAL_TABLET | Freq: Every day | ORAL | 0 refills | Status: AC

## 2022-10-01 MED ORDER — ACETAMINOPHEN 500 MG PO TABS
1000.0000 mg | ORAL_TABLET | Freq: Once | ORAL | Status: AC
  Administered 2022-10-01: 1000 mg via ORAL
  Filled 2022-10-01: qty 2

## 2022-10-01 MED ORDER — KETOROLAC TROMETHAMINE 15 MG/ML IJ SOLN
15.0000 mg | Freq: Once | INTRAMUSCULAR | Status: AC
  Administered 2022-10-01: 15 mg via INTRAVENOUS
  Filled 2022-10-01: qty 1

## 2022-10-01 MED ORDER — IOHEXOL 300 MG/ML  SOLN
100.0000 mL | Freq: Once | INTRAMUSCULAR | Status: AC | PRN
  Administered 2022-10-01: 100 mL via INTRAVENOUS

## 2022-10-01 NOTE — ED Provider Notes (Signed)
[place] [facility] DEPARTMENT AT [facility] Provider Note   CSN: [id number] Arrival date & time: 10/01/22  1537     History Chief Complaint  Patient presents with   Abdominal Pain    HPI Jacqueline Bray is a 57 y.o. female presenting for chief plaint of abdominal pain.  Endorses 2 weeks of intermittent abdominal pain diffusely.  Radiates to the right upper quadrant and then to the right lower than to the left lower.  Had a similar presentation a year ago which was told secondary to stool burden but she has been compliant with her laxative still having daily bowel movements.  Denies fevers chills nausea vomiting syncope or shortness of breath.  Otherwise ambulatory tolerating p.o. intake.   Patient's recorded medical, surgical, social, medication list and allergies were reviewed in the Snapshot window as part of the initial history.   Review of Systems   Review of Systems  Constitutional:  Negative for chills and fever.  HENT:  Negative for ear pain and sore throat.   Eyes:  Negative for pain and visual disturbance.  Respiratory:  Negative for cough and shortness of breath.   Cardiovascular:  Negative for chest pain and palpitations.  Gastrointestinal:  Positive for abdominal pain. Negative for vomiting.  Genitourinary:  Negative for dysuria and hematuria.  Musculoskeletal:  Negative for arthralgias and back pain.  Skin:  Negative for color change and rash.  Neurological:  Negative for seizures and syncope.  All other systems reviewed and are negative.   Physical Exam Updated Vital Signs BP (!) 138/90 (BP Location: Left Arm)   Pulse 85   Temp (!) 97.4 F (36.3 C) (Tympanic)   Resp 18   Ht 5\' 6"  (1.676 m)   Wt 73.5 kg   LMP 12/30/2011   SpO2 99%   BMI 26.15 kg/m  Physical Exam Vitals and nursing note reviewed.  Constitutional:      General: She is not in acute distress.    Appearance: She is well-developed.  HENT:     Head: Normocephalic and  atraumatic.  Eyes:     Conjunctiva/sclera: Conjunctivae normal.  Cardiovascular:     Rate and Rhythm: Normal rate and regular rhythm.     Heart sounds: No murmur heard. Pulmonary:     Effort: Pulmonary effort is normal. No respiratory distress.     Breath sounds: Normal breath sounds.  Abdominal:     Palpations: Abdomen is soft.     Tenderness: There is abdominal tenderness. There is no guarding.  Musculoskeletal:        General: No swelling.     Cervical back: Neck supple.  Skin:    General: Skin is warm and dry.     Capillary Refill: Capillary refill takes less than 2 seconds.  Neurological:     Mental Status: She is alert.  Psychiatric:        Mood and Affect: Mood normal.      ED Course/ Medical Decision Making/ A&P    Procedures Procedures   Medications Ordered in ED Medications  acetaminophen (TYLENOL) tablet 1,000 mg (1,000 mg Oral Given 10/01/22 1732)  ketorolac (TORADOL) 15 MG/ML injection 15 mg (15 mg Intravenous Given 10/01/22 1733)  iohexol (OMNIPAQUE) 300 MG/ML solution 100 mL (100 mLs Intravenous Contrast Given 10/01/22 1811)   Medical Decision Making:   Jacqueline Bray is a 57 y.o. female who presented to the ED today with abdominal pain, detailed above.    Complete initial physical exam performed,  notably the patient  was HDS in NAD.     Reviewed and confirmed nursing documentation for past medical history, family history, social history.    Initial Assessment:   With the patient's presentation of abdominal pain, most likely diagnosis is nonsepcific etiology. Other diagnoses were considered including (but not limited to) gastroenteritis, colitis, small bowel obstruction, appendicitis, cholecystitis, pancreatitis, nephrolithiasis, UTI, pyleonephritis. These are considered less likely due to history of present illness and physical exam findings.   This is most consistent with an acute life/limb threatening illness complicated by underlying chronic conditions.    Initial Plan:  CBC/CMP to evaluate for underlying infectious/metabolic etiology for patient's abdominal pain  Lipase to evaluate for pancreatitis  US and  CTAB/Pelvis with contrast to evaluate for structural/surgical etiology of patients' severe abdominal pain.  Urinalysis and repeat physical assessment to evaluate for UTI/Pyelonpehritis  Empiric management of symptoms with escalating pain control and antiemetics as needed.   Initial Study Results:   Laboratory  All laboratory results reviewed without evidence of clinically relevant pathology.   EKG EKG was reviewed independently. Rate, rhythm, axis, intervals all examined and without medically relevant abnormality. ST segments without concerns for elevations.    Radiology All images reviewed independently. Agree with radiology report at this time.   CT ABDOMEN PELVIS W CONTRAST  Result Date: 10/01/2022 CLINICAL DATA:  Acute, non localized abdominal pain. EXAM: CT ABDOMEN AND PELVIS WITH CONTRAST TECHNIQUE: Multidetector CT imaging of the abdomen and pelvis was performed using the standard protocol following bolus administration of intravenous contrast. RADIATION DOSE REDUCTION: This exam was performed according to the departmental dose-optimization program which includes automated exposure control, adjustment of the mA and/or kV according to patient size and/or use of iterative reconstruction technique. CONTRAST:  OMNIPAQUE IOHEXOL 300 MG/ML  SOLN COMPARISON:  Right upper quadrant abdomen ultrasound obtained earlier today. FINDINGS: Lower chest: Unremarkable. Hepatobiliary: 2 small liver cysts.  Normal appearing gallbladder. Pancreas: Unremarkable. No pancreatic ductal dilatation or surrounding inflammatory changes. Spleen: Normal in size without focal abnormality. Adrenals/Urinary Tract: Small right renal cyst. This does not need imaging follow-up. Unremarkable left kidney, ureters and urinary bladder. Normal-appearing adrenal glands.  Stomach/Bowel: Stomach is within normal limits. Appendix appears normal. No evidence of bowel wall thickening, distention, or inflammatory changes. Vascular/Lymphatic: No significant vascular findings are present. No enlarged abdominal or pelvic lymph nodes. Reproductive: Mildly enlarged and heterogeneous uterus with mildly lobulated contours. Two small myometrial calcifications. Other: No abdominal wall hernia or abnormality. No abdominopelvic ascites. Musculoskeletal: Unremarkable bones. IMPRESSION: 1. No acute abnormality. 2. Mildly enlarged and heterogeneous uterus with mildly lobulated contours, compatible with fibroids. Electronically Signed   By: Beckie Salts M.D.   On: 10/01/2022 18:47   US Abdomen Limited RUQ (LIVER/GB)  Result Date: 10/01/2022 CLINICAL DATA:  Right upper quadrant abdominal pain. EXAM: ULTRASOUND ABDOMEN LIMITED RIGHT UPPER QUADRANT COMPARISON:  None Available. FINDINGS: Gallbladder: No gallstones or wall thickening visualized. No pericholecystic fluid. Common bile duct: Diameter: 3 mm Liver: No focal lesion identified. Within normal limits in parenchymal echogenicity. Portal vein is patent on color Doppler imaging with normal direction of blood flow towards the liver. Other: None. IMPRESSION: Unremarkable right upper quadrant ultrasound. Electronically Signed   By: Irish Lack M.D.   On: 10/01/2022 17:16    Final Reassessment and Plan:   Patient observed in the emergency room for 4 hours.  No other symptoms.  Appears improved on repeat evaluation after Toradol and Tylenol. Given intermittent nature of pain, uterine fibroids may  be the potential etiology.  No other acute diagnosis seems consistent with patient's presentation based on this evaluation today.  Discussed clinical overlap with similar syndrome such as appendicitis recommend close follow-up with primary care provider for reassessment within 48 hours as well as obstetrician/gynecologist for long-term care and  management. Disposition:  I have considered need for hospitalization, however, considering all of the above, I believe this patient is stable for discharge at this time.  Patient/family educated about specific return precautions for given chief complaint and symptoms.  Patient/family educated about follow-up with PCP and GYN.     Patient/family expressed understanding of return precautions and need for follow-up. Patient spoken to regarding all imaging and laboratory results and appropriate follow up for these results. All education provided in verbal form with additional information in written form. Time was allowed for answering of patient questions. Patient discharged.    Emergency Department Medication Summary:   Medications  acetaminophen (TYLENOL) tablet 1,000 mg (1,000 mg Oral Given 10/01/22 1732)  ketorolac (TORADOL) 15 MG/ML injection 15 mg (15 mg Intravenous Given 10/01/22 1733)  iohexol (OMNIPAQUE) 300 MG/ML solution 100 mL (100 mLs Intravenous Contrast Given 10/01/22 1811)           Clinical Impression:  1. Abdominal pain, unspecified abdominal location      Discharge   Final Clinical Impression(s) / ED Diagnoses Final diagnoses:  Abdominal pain, unspecified abdominal location    Rx / DC Orders ED Discharge Orders     None         Glyn Ade, MD 10/01/22 [facility] [place]

## 2022-10-01 NOTE — ED Triage Notes (Signed)
Pt c/o abdominal pain  Rt. Upper quad and flank area Denies n/v Constant pain
